# Patient Record
Sex: Male | Born: 1945 | Race: White | Hispanic: No | Marital: Married | Smoking: Never smoker
Health system: Southern US, Community
[De-identification: ages and names within clinical notes are randomized; demographics above are authoritative.]

## PROBLEM LIST (undated history)

## (undated) DIAGNOSIS — K219 Gastro-esophageal reflux disease without esophagitis: Secondary | ICD-10-CM

## (undated) DIAGNOSIS — I209 Angina pectoris, unspecified: Secondary | ICD-10-CM

## (undated) DIAGNOSIS — I10 Essential (primary) hypertension: Secondary | ICD-10-CM

## (undated) DIAGNOSIS — I2510 Atherosclerotic heart disease of native coronary artery without angina pectoris: Secondary | ICD-10-CM

## (undated) HISTORY — PX: CORONARY ANGIOPLASTY WITH STENT PLACEMENT: SHX49

## (undated) HISTORY — PX: KNEE ARTHROSCOPY: SHX127

## (undated) HISTORY — PX: HERNIA REPAIR: SHX51

## (undated) HISTORY — PX: TONSILLECTOMY: SUR1361

## (undated) HISTORY — PX: CORONARY ARTERY BYPASS GRAFT: SHX141

---

## 1998-04-05 ENCOUNTER — Emergency Department (HOSPITAL_COMMUNITY): Admission: EM | Admit: 1998-04-05 | Discharge: 1998-04-05 | Payer: Self-pay | Admitting: Emergency Medicine

## 2004-06-26 ENCOUNTER — Emergency Department (HOSPITAL_COMMUNITY): Admission: EM | Admit: 2004-06-26 | Discharge: 2004-06-26 | Payer: Self-pay | Admitting: Emergency Medicine

## 2007-01-27 ENCOUNTER — Ambulatory Visit (HOSPITAL_COMMUNITY): Admission: RE | Admit: 2007-01-27 | Discharge: 2007-01-27 | Payer: Self-pay | Admitting: Surgery

## 2011-01-05 ENCOUNTER — Encounter: Payer: Self-pay | Admitting: Family Medicine

## 2012-03-30 ENCOUNTER — Encounter (HOSPITAL_COMMUNITY): Payer: Self-pay | Admitting: Physical Medicine and Rehabilitation

## 2012-03-30 ENCOUNTER — Inpatient Hospital Stay (HOSPITAL_COMMUNITY)
Admission: EM | Admit: 2012-03-30 | Discharge: 2012-04-01 | DRG: 287 | Disposition: A | Discharge status: Home / Self Care | Payer: PRIVATE HEALTH INSURANCE | Attending: Cardiology | Admitting: Cardiology

## 2012-03-30 ENCOUNTER — Emergency Department (HOSPITAL_COMMUNITY): Payer: PRIVATE HEALTH INSURANCE

## 2012-03-30 DIAGNOSIS — I10 Essential (primary) hypertension: Secondary | ICD-10-CM | POA: Diagnosis present

## 2012-03-30 DIAGNOSIS — Z951 Presence of aortocoronary bypass graft: Secondary | ICD-10-CM

## 2012-03-30 DIAGNOSIS — I200 Unstable angina: Secondary | ICD-10-CM | POA: Diagnosis present

## 2012-03-30 DIAGNOSIS — Z8249 Family history of ischemic heart disease and other diseases of the circulatory system: Secondary | ICD-10-CM

## 2012-03-30 DIAGNOSIS — Z79899 Other long term (current) drug therapy: Secondary | ICD-10-CM

## 2012-03-30 DIAGNOSIS — Z9861 Coronary angioplasty status: Secondary | ICD-10-CM

## 2012-03-30 DIAGNOSIS — I249 Acute ischemic heart disease, unspecified: Secondary | ICD-10-CM

## 2012-03-30 DIAGNOSIS — E7800 Pure hypercholesterolemia, unspecified: Secondary | ICD-10-CM | POA: Diagnosis present

## 2012-03-30 DIAGNOSIS — I2510 Atherosclerotic heart disease of native coronary artery without angina pectoris: Principal | ICD-10-CM | POA: Diagnosis present

## 2012-03-30 DIAGNOSIS — I2582 Chronic total occlusion of coronary artery: Secondary | ICD-10-CM | POA: Diagnosis present

## 2012-03-30 HISTORY — DX: Angina pectoris, unspecified: I20.9

## 2012-03-30 HISTORY — DX: Atherosclerotic heart disease of native coronary artery without angina pectoris: I25.10

## 2012-03-30 HISTORY — DX: Essential (primary) hypertension: I10

## 2012-03-30 HISTORY — DX: Gastro-esophageal reflux disease without esophagitis: K21.9

## 2012-03-30 LAB — CARDIAC PANEL(CRET KIN+CKTOT+MB+TROPI)
CK, MB: 2.7 ng/mL (ref 0.3–4.0)
Relative Index: INVALID (ref 0.0–2.5)
Relative Index: INVALID (ref 0.0–2.5)
Total CK: 82 U/L (ref 7–232)
Total CK: 75 U/L (ref 7–232)

## 2012-03-30 LAB — CBC
HCT: 46.4 % (ref 39.0–52.0)
MCV: 95.5 fL (ref 78.0–100.0)
Platelets: 198 10*3/uL (ref 150–400)
Platelets: 213 10*3/uL (ref 150–400)
RBC: 5.08 MIL/uL (ref 4.22–5.81)
RBC: 4.9 MIL/uL (ref 4.22–5.81)
RDW: 12.6 % (ref 11.5–15.5)
WBC: 6.2 10*3/uL (ref 4.0–10.5)
WBC: 7.6 10*3/uL (ref 4.0–10.5)

## 2012-03-30 LAB — DIFFERENTIAL
Basophils Absolute: 0 10*3/uL (ref 0.0–0.1)
Basophils Absolute: 0 10*3/uL (ref 0.0–0.1)
Eosinophils Relative: 1 % (ref 0–5)
Lymphocytes Relative: 24 % (ref 12–46)
Lymphocytes Relative: 26 % (ref 12–46)
Lymphs Abs: 1.5 10*3/uL (ref 0.7–4.0)
Lymphs Abs: 2 10*3/uL (ref 0.7–4.0)
Neutro Abs: 4.1 10*3/uL (ref 1.7–7.7)
Neutro Abs: 4.8 10*3/uL (ref 1.7–7.7)

## 2012-03-30 LAB — COMPREHENSIVE METABOLIC PANEL
ALT: 36 U/L (ref 0–53)
ALT: 34 U/L (ref 0–53)
AST: 32 U/L (ref 0–37)
AST: 31 U/L (ref 0–37)
Albumin: 3.9 g/dL (ref 3.5–5.2)
Alkaline Phosphatase: 72 U/L (ref 39–117)
Alkaline Phosphatase: 62 U/L (ref 39–117)
CO2: 27 mEq/L (ref 19–32)
Calcium: 9.8 mg/dL (ref 8.4–10.5)
Chloride: 103 mEq/L (ref 96–112)
Chloride: 102 mEq/L (ref 96–112)
GFR calc Af Amer: >90 mL/min (ref >90)
GFR calc non Af Amer: 86 mL/min — ABNORMAL LOW (ref >90)
Glucose, Bld: 107 mg/dL — ABNORMAL HIGH (ref 70–99)
Potassium: 4.5 mEq/L (ref 3.5–5.1)
Potassium: 3.9 mEq/L (ref 3.5–5.1)
Sodium: 140 mEq/L (ref 135–145)
Total Bilirubin: 0.8 mg/dL (ref 0.3–1.2)
Total Bilirubin: 0.9 mg/dL (ref 0.3–1.2)

## 2012-03-30 LAB — APTT: aPTT: 32 seconds (ref 24–37)

## 2012-03-30 LAB — TSH: TSH: 1.49 u[IU]/mL (ref 0.350–4.500)

## 2012-03-30 LAB — PROTIME-INR: Prothrombin Time: 12.7 seconds (ref 11.6–15.2)

## 2012-03-30 MED ORDER — ONDANSETRON HCL 4 MG/2ML IJ SOLN: 4.0000 mg | Freq: Four times a day (QID) | INTRAMUSCULAR | Status: DC | PRN

## 2012-03-30 MED ORDER — POTASSIUM CHLORIDE IN NACL 20-0.9 MEQ/L-% IV SOLN
INTRAVENOUS | Status: DC
  Administered 2012-03-30: 18:00:00 via INTRAVENOUS
  Filled 2012-03-30 (×2): qty 1000

## 2012-03-30 MED ORDER — CLOPIDOGREL BISULFATE 300 MG PO TABS
300.0000 mg | ORAL_TABLET | Freq: Once | ORAL | Status: AC
  Administered 2012-03-30: 300 mg via ORAL
  Filled 2012-03-30: qty 1

## 2012-03-30 MED ORDER — PANTOPRAZOLE SODIUM 40 MG PO TBEC
40.0000 mg | DELAYED_RELEASE_TABLET | Freq: Every day | ORAL | Status: DC
  Administered 2012-03-30 – 2012-04-01 (×2): 40 mg via ORAL
  Filled 2012-03-30 (×2): qty 1

## 2012-03-30 MED ORDER — OMEGA-3-ACID ETHYL ESTERS 1 G PO CAPS
1.0000 g | ORAL_CAPSULE | Freq: Two times a day (BID) | ORAL | Status: DC
  Administered 2012-03-30 – 2012-04-01 (×4): 1 g via ORAL
  Filled 2012-03-30 (×5): qty 1

## 2012-03-30 MED ORDER — ASPIRIN 81 MG PO CHEW: 324.0000 mg | CHEWABLE_TABLET | ORAL | Status: AC

## 2012-03-30 MED ORDER — DIAZEPAM 5 MG PO TABS
5.0000 mg | ORAL_TABLET | ORAL | Status: AC
  Administered 2012-03-31: 5 mg via ORAL
  Filled 2012-03-30: qty 1

## 2012-03-30 MED ORDER — SODIUM CHLORIDE 0.9 % IJ SOLN
3.0000 mL | Freq: Two times a day (BID) | INTRAMUSCULAR | Status: DC
  Administered 2012-03-31: 3 mL via INTRAVENOUS

## 2012-03-30 MED ORDER — ASPIRIN 81 MG PO CHEW
324.0000 mg | CHEWABLE_TABLET | Freq: Once | ORAL | Status: AC
  Administered 2012-03-30: 324 mg via ORAL
  Filled 2012-03-30: qty 4

## 2012-03-30 MED ORDER — ASPIRIN 300 MG RE SUPP: 300.0000 mg | RECTAL | Status: AC

## 2012-03-30 MED ORDER — SODIUM CHLORIDE 0.9 % IV SOLN: 250.0000 mL | INTRAVENOUS | Status: DC | PRN

## 2012-03-30 MED ORDER — HEPARIN BOLUS VIA INFUSION
4000.0000 [IU] | Freq: Once | INTRAVENOUS | Status: AC
  Administered 2012-03-30: 4000 [IU] via INTRAVENOUS

## 2012-03-30 MED ORDER — ATORVASTATIN CALCIUM 80 MG PO TABS
80.0000 mg | ORAL_TABLET | Freq: Every day | ORAL | Status: DC
  Administered 2012-03-30 – 2012-03-31 (×2): 80 mg via ORAL
  Filled 2012-03-30 (×3): qty 1

## 2012-03-30 MED ORDER — METOPROLOL TARTRATE 25 MG PO TABS
25.0000 mg | ORAL_TABLET | Freq: Two times a day (BID) | ORAL | Status: DC
  Administered 2012-03-30 – 2012-03-31 (×2): 25 mg via ORAL
  Filled 2012-03-30 (×3): qty 1

## 2012-03-30 MED ORDER — ASPIRIN 81 MG PO CHEW
324.0000 mg | CHEWABLE_TABLET | ORAL | Status: AC
  Administered 2012-03-31: 324 mg via ORAL
  Filled 2012-03-30: qty 4

## 2012-03-30 MED ORDER — SODIUM CHLORIDE 0.9 % IJ SOLN: 3.0000 mL | INTRAMUSCULAR | Status: DC | PRN

## 2012-03-30 MED ORDER — NITROGLYCERIN IN D5W 200-5 MCG/ML-% IV SOLN: 5.0000 ug/min | INTRAVENOUS | Status: DC

## 2012-03-30 MED ORDER — ACETAMINOPHEN 325 MG PO TABS: 650.0000 mg | ORAL_TABLET | ORAL | Status: DC | PRN

## 2012-03-30 MED ORDER — NITROGLYCERIN 0.4 MG SL SUBL: 0.4000 mg | SUBLINGUAL_TABLET | SUBLINGUAL | Status: DC | PRN

## 2012-03-30 MED ORDER — HEPARIN (PORCINE) IN NACL 100-0.45 UNIT/ML-% IJ SOLN
800.0000 [IU]/h | INTRAMUSCULAR | Status: DC
  Administered 2012-03-30: 800 [IU]/h via INTRAVENOUS
  Filled 2012-03-30: qty 250

## 2012-03-30 MED ORDER — ALPRAZOLAM 0.25 MG PO TABS
0.2500 mg | ORAL_TABLET | Freq: Two times a day (BID) | ORAL | Status: DC | PRN
  Administered 2012-03-31: 0.25 mg via ORAL
  Filled 2012-03-30: qty 1

## 2012-03-30 MED ORDER — ASPIRIN EC 81 MG PO TBEC
81.0000 mg | DELAYED_RELEASE_TABLET | Freq: Every day | ORAL | Status: DC
  Administered 2012-04-01: 81 mg via ORAL
  Filled 2012-03-30 (×2): qty 1

## 2012-03-30 MED ORDER — SODIUM CHLORIDE 0.9 % IV SOLN
INTRAVENOUS | Status: DC
  Administered 2012-03-30 – 2012-03-31 (×3): via INTRAVENOUS

## 2012-03-30 NOTE — ED Notes (Signed)
Pt presents to department for evaluation of midsternal non radiating chest pain. Intermittent x2 weeks, states pain became worse this morning. Also states nausea and diaphoresis. History of CABG. Nothing makes pain worse at the time. States mild pressure, 2/10 at the time. Also states severe anxiety and stress from family issues lately. He is alert and oriented x4.

## 2012-03-30 NOTE — H&P (Signed)
[name] is an 66 y.o. male.   Chief Complaint: Chest pain HPI: Patient is 66 year old white male with past medical history significant for coronary artery disease status post PTCA stenting in the past in 2003 her in [facility] subsequently had CABG in tube 2004 hypertension hypercholesteremia positive family history of coronary artery disease was transferred from [facility] patient complaints off for retrosternal chest pain described as pressure grade 4/10 which started around 4 AM this morning off and on associated with nausea and diaphoresis associated with feeling weak and tired  patient states lately he has been under a lot of stress which triggers the chest pain and minimal exertion. Denies any palpitation lightheadedness or syncope denies history of PND orthopnea leg swelling denies any recent cardiac workup since his open-heart surgery in 2004.  History reviewed. No pertinent past medical history.  Past Surgical History  Procedure Date  . Coronary artery bypass graft     History reviewed. No pertinent family history. Social History:  reports that he has never smoked. He does not have any smokeless tobacco history on file. He reports that he does not drink alcohol or use illicit drugs.  Allergies: No Known Allergies  Medications Prior to Admission  Medication Dose Route Frequency Provider Last Rate Last Dose  . aspirin chewable tablet 324 mg  324 mg Oral Once Joya Gaskins, MD   324 mg at 03/30/12 1324   Medications Prior to Admission  Medication Sig Dispense Refill  . metoprolol succinate (TOPROL-XL) 100 MG 24 hr tablet Take 50 mg by mouth daily. Patient takes at lunchtime Take with or immediately following a meal.      . pravastatin (PRAVACHOL) 40 MG tablet Take 40 mg by mouth every evening.        Results for orders placed during the hospital encounter of 03/30/12 (from the past 48 hour(s))  CBC     Status: Abnormal   Collection  Time   03/30/12 11:57 AM      Component Value Range Comment   WBC 6.2  4.0 - 10.5 (K/uL)    RBC 5.08  4.22 - 5.81 (MIL/uL)    Hemoglobin 17.4 (*) 13.0 - 17.0 (g/dL)    HCT 16.1  09.6 - 04.5 (%)    MCV 95.5  78.0 - 100.0 (fL)    MCH 34.3 (*) 26.0 - 34.0 (pg)    MCHC 35.9  30.0 - 36.0 (g/dL)    RDW 40.9  81.1 - 91.4 (%)    Platelets 198  150 - 400 (K/uL)   DIFFERENTIAL     Status: Normal   Collection Time   03/30/12 11:57 AM      Component Value Range Comment   Neutrophils Relative 66  43 - 77 (%)    Neutro Abs 4.1  1.7 - 7.7 (K/uL)    Lymphocytes Relative 24  12 - 46 (%)    Lymphs Abs 1.5  0.7 - 4.0 (K/uL)    Monocytes Relative 9  3 - 12 (%)    Monocytes Absolute 0.5  0.1 - 1.0 (K/uL)    Eosinophils Relative 1  0 - 5 (%)    Eosinophils Absolute 0.1  0.0 - 0.7 (K/uL)    Basophils Relative 1  0 - 1 (%)    Basophils Absolute 0.0  0.0 - 0.1 (K/uL)   COMPREHENSIVE METABOLIC PANEL     Status: Abnormal   Collection Time   03/30/12 11:57 AM  Component Value Range Comment   Sodium 140  135 - 145 (mEq/L)    Potassium 4.5  3.5 - 5.1 (mEq/L)    Chloride 103  96 - 112 (mEq/L)    CO2 27  19 - 32 (mEq/L)    Glucose, Bld 107 (*) 70 - 99 (mg/dL)    BUN 12  6 - 23 (mg/dL)    Creatinine, Ser 6.96  0.50 - 1.35 (mg/dL)    Calcium 9.8  8.4 - 10.5 (mg/dL)    Total Protein 7.3  6.0 - 8.3 (g/dL)    Albumin 4.0  3.5 - 5.2 (g/dL)    AST 32  0 - 37 (U/L)    ALT 36  0 - 53 (U/L)    Alkaline Phosphatase 72  39 - 117 (U/L)    Total Bilirubin 0.8  0.3 - 1.2 (mg/dL)    GFR calc non Af Amer 86 (*) >90 (mL/min)    GFR calc Af Amer >90  >90 (mL/min)   POCT I-STAT TROPONIN I     Status: Normal   Collection Time   03/30/12 12:12 PM      Component Value Range Comment   Troponin i, poc 0.00  0.00 - 0.08 (ng/mL)    Comment 3             Dg Chest 2 View  03/30/2012  *RADIOLOGY REPORT*  Clinical Data: Chest pain, status post CABG  CHEST - 2 VIEW  Comparison: 01/22/2007  Findings: Cardiomediastinal  silhouette is stable.  No acute infiltrate or pleural effusion.  No pulmonary edema.  Bony thorax is stable.  IMPRESSION: Status post CABG.  No active disease.  Original Report Authenticated By: Natasha Mead, M.D.    Review of Systems  Constitutional: Negative for fever, chills and weight loss.  HENT: Negative for hearing loss.   Eyes: Negative for blurred vision and double vision.  Respiratory: Negative for cough, hemoptysis and sputum production.   Cardiovascular: Positive for chest pain. Negative for palpitations, orthopnea, claudication, leg swelling and PND.  Gastrointestinal: Positive for nausea. Negative for vomiting and abdominal pain.  Genitourinary: Negative for dysuria and urgency.  Musculoskeletal: Negative for myalgias.  Neurological: Negative for dizziness and headaches.    Blood pressure 157/93, pulse 71, temperature 97.6 F (36.4 C), temperature source Oral, resp. rate 13, SpO2 99.00%. Physical Exam  Constitutional: He is oriented to person, place, and time. He appears well-developed and well-nourished.  HENT:  Head: Normocephalic and atraumatic.  Eyes: Conjunctivae are normal. Pupils are equal, round, and reactive to light. Left eye exhibits no discharge. No scleral icterus.  Neck: Normal range of motion. Neck supple. No JVD present. No tracheal deviation present. No thyromegaly present.  Cardiovascular: Normal rate and regular rhythm.  Exam reveals no friction rub.        I soft systolic murmur and S4 gallop noted there is no S3 gallop  Respiratory: Effort normal and breath sounds normal. No respiratory distress. He has no wheezes. He has no rales.  GI: Soft. Bowel sounds are normal. He exhibits no distension. There is no tenderness. There is no rebound and no guarding.  Musculoskeletal: He exhibits no edema and no tenderness.  Lymphadenopathy:    He has no cervical adenopathy.  Neurological: He is alert and oriented to person, place, and time.      Assessment/Plan Acute coronary syndrome Coronary artery disease status post CABG x3 in 2004 Hypertension Hypercholesteremia Positive family history of coronary artery disease Plan To admit to  telemetry Rule out MI protocol Start IV heparin nitrates aspirin statins blockers and ACE inhibitors Heart discussed with patient and his wife regarding her left cath possible PTCA stenting its risk and benefits i.e. death MI stroke need for emergency CABG local vascular complications etc. and consents for PCI   [facility] 03/30/2012, 3:37 PM

## 2012-03-30 NOTE — Progress Notes (Signed)
ANTICOAGULATION CONSULT NOTE - Initial Consult  Pharmacy Consult for Heparin Indication: chest pain/ACS  No Known Allergies  Patient Measurements:   Heparin Dosing Weight: 68   Vital Signs: Temp: 97.6 F (36.4 C) (04/15 1120) Temp src: Oral (04/15 1120) BP: 157/93 mmHg (04/15 1120) Pulse Rate: 71  (04/15 1120)  Labs:  Basename 03/30/12 1157  HGB 17.4*  HCT 48.5  PLT 198  APTT --  LABPROT --  INR --  HEPARINUNFRC --  CREATININE 0.91  CKTOTAL --  CKMB --  TROPONINI --   CrCl is unknown because there is no height on file for the current visit.  Medical History: History reviewed. No pertinent past medical history.   Assessment: 66 yo M being admitted for r/o ACS.  No recent bleeding per patient.  Pharmacy consulted to manage heparin.  Patient to have a cath tomorrow.   Goal of Therapy:  Heparin level 0.3-0.7   Plan:  1.  Heparin 4000 unit IV bolus 2.  Begin heparin gtt at 800 units/hr 3.  Heparin level 6 hours after beginning 4.  Daily heparin level, CBC  Rolland Porter, Pharm.D., BCPS Clinical Pharmacist Pager: [phone]

## 2012-03-30 NOTE — ED Notes (Signed)
Dr harwani at bedside.  

## 2012-03-30 NOTE — ED Notes (Signed)
Pt to be admitted and have cardiac cathetertization in the morning. Denies sx presently.

## 2012-03-30 NOTE — Plan of Care (Signed)
Problem: Phase II Progression Outcomes Goal: Cath/PCI Day Path if indicated Outcome: Completed/Met Date Met:  03/30/12 Scheduled for 03/31/12

## 2012-03-30 NOTE — ED Provider Notes (Signed)
History     CSN: [id number]  Arrival date & time 03/30/12  1117   First MD Initiated Contact with Patient 03/30/12 1302      Chief Complaint  Patient presents with  . Chest Pain     Patient is a 66 y.o. male presenting with chest pain. The history is provided by the patient.  Chest Pain The chest pain began 3 - 5 hours ago. Episode Length: several hours. Chest pain occurs intermittently. The chest pain is improving. The pain is associated with stress. The severity of the pain is moderate. The quality of the pain is described as pressure-like. The pain does not radiate. Chest pain is worsened by stress. Primary symptoms include nausea.  Associated symptoms include diaphoresis. He tried aspirin for the symptoms.    Pt reports intermittent episodes of chest pressure for past several weeks, had an episode this morning and wanted to be evaluated He reports fatigue as well that is increased, mostly at night  PMH - CAD  Past Surgical History  Procedure Date  . Coronary artery bypass graft     History reviewed. No pertinent family history.  History  Substance Use Topics  . Smoking status: Never Smoker   . Smokeless tobacco: Not on file  . Alcohol Use: No      Review of Systems  Constitutional: Positive for diaphoresis.  Cardiovascular: Positive for chest pain.  Gastrointestinal: Positive for nausea.  All other systems reviewed and are negative.    Allergies  Review of patient's allergies indicates no known allergies.  Home Medications   Current Outpatient Rx  Name Route Sig Dispense Refill  . ASPIRIN EC 81 MG PO TBEC Oral Take 81 mg by mouth daily.    Marland Kitchen METOPROLOL SUCCINATE ER 100 MG PO TB24 Oral Take 50 mg by mouth daily. Patient takes at lunchtime Take with or immediately following a meal.    . FISH OIL PO Oral Take 1-2 capsules by mouth 3 (three) times daily. Patient takes 1 in the morning, 1 at lunchtime, 2 with dinner    . PRAVASTATIN SODIUM 40 MG PO TABS Oral  Take 40 mg by mouth every evening.      BP 157/93  Pulse 71  Temp(Src) 97.6 F (36.4 C) (Oral)  Resp 13  SpO2 99%  Physical Exam CONSTITUTIONAL: Well developed/well nourished HEAD AND FACE: Normocephalic/atraumatic EYES: EOMI ENMT: Mucous membranes moist NECK: supple no meningeal signs SPINE:entire spine nontender CV: S1/S2 noted, no murmurs/rubs/gallops noted LUNGS: Lungs are clear to auscultation bilaterally, no apparent distress ABDOMEN: soft, nontender, no rebound or guarding NEURO: Pt is awake/alert, moves all extremitiesx4 EXTREMITIES: pulses normal, full ROM SKIN: warm, color normal PSYCH: no abnormalities of mood noted  ED Course  Procedures (including critical care time)  Labs Reviewed  CBC - Abnormal; Notable for the following:    Hemoglobin 17.4 (*)    MCH 34.3 (*)    All other components within normal limits  COMPREHENSIVE METABOLIC PANEL - Abnormal; Notable for the following:    Glucose, Bld 107 (*)    GFR calc non Af Amer 86 (*)    All other components within normal limits  DIFFERENTIAL  POCT I-STAT TROPONIN I   Dg Chest 2 View  03/30/2012  *RADIOLOGY REPORT*  Clinical Data: Chest pain, status post CABG  CHEST - 2 VIEW  Comparison: 01/22/2007  Findings: Cardiomediastinal silhouette is stable.  No acute infiltrate or pleural effusion.  No pulmonary edema.  Bony thorax is stable.  IMPRESSION:  Status post CABG.  No active disease.  Original Report Authenticated By: Natasha Mead, M.D.    2:08 PM Pt improved at this time D/w cardiology dr Sharyn Lull to see patient ASA ordered for patient No active CP at this time   MDM  Nursing notes reviewed and considered in documentation All labs/vitals reviewed and considered xrays reviewed and considered        Date: 03/30/2012  Rate: 68  Rhythm: normal sinus rhythm  QRS Axis: normal  Intervals: normal  ST/T Wave abnormalities: nonspecific ST changes  Conduction Disutrbances:none     Joya Gaskins,  MD 03/30/12 1411

## 2012-03-30 NOTE — ED Provider Notes (Deleted)
Pt with CP, improved at present EKG reviewed ASA ordered Will need further evaluation BP 157/93  Pulse 71  Temp(Src) 97.6 F (36.4 C) (Oral)  Resp 13  SpO2 99%   Joya Gaskins, MD 03/30/12 1306  Joya Gaskins, MD 03/30/12 1407

## 2012-03-31 ENCOUNTER — Encounter (HOSPITAL_COMMUNITY): Payer: Self-pay | Admitting: General Practice

## 2012-03-31 ENCOUNTER — Encounter (HOSPITAL_COMMUNITY)
Admission: EM | Disposition: A | Discharge status: Home / Self Care | Payer: Self-pay | Source: Home / Self Care | Attending: Cardiology

## 2012-03-31 HISTORY — PX: LEFT HEART CATHETERIZATION WITH CORONARY ANGIOGRAM: SHX5451

## 2012-03-31 LAB — CBC
MCH: 34 pg (ref 26.0–34.0)
MCHC: 35.1 g/dL (ref 30.0–36.0)
MCV: 96.8 fL (ref 78.0–100.0)
Platelets: 181 10*3/uL (ref 150–400)
RDW: 12.9 % (ref 11.5–15.5)

## 2012-03-31 LAB — LIPID PANEL
Cholesterol: 160 mg/dL (ref 0–200)
HDL: 45 mg/dL (ref >39)
Total CHOL/HDL Ratio: 3.6 RATIO
VLDL: 22 mg/dL (ref 0–40)

## 2012-03-31 LAB — CARDIAC PANEL(CRET KIN+CKTOT+MB+TROPI): Relative Index: INVALID (ref 0.0–2.5)

## 2012-03-31 SURGERY — LEFT HEART CATHETERIZATION WITH CORONARY ANGIOGRAM
Anesthesia: LOCAL

## 2012-03-31 MED ORDER — ATROPINE SULFATE 1 MG/ML IJ SOLN
INTRAMUSCULAR | Status: AC
  Filled 2012-03-31: qty 1

## 2012-03-31 MED ORDER — ACETAMINOPHEN 325 MG PO TABS: 650.0000 mg | ORAL_TABLET | ORAL | Status: DC | PRN

## 2012-03-31 MED ORDER — CLOPIDOGREL BISULFATE 75 MG PO TABS
75.0000 mg | ORAL_TABLET | Freq: Every day | ORAL | Status: DC
  Administered 2012-04-01: 75 mg via ORAL
  Filled 2012-03-31: qty 1

## 2012-03-31 MED ORDER — MIDAZOLAM HCL 2 MG/2ML IJ SOLN
INTRAMUSCULAR | Status: AC
  Filled 2012-03-31: qty 2

## 2012-03-31 MED ORDER — HEPARIN (PORCINE) IN NACL 2-0.9 UNIT/ML-% IJ SOLN
INTRAMUSCULAR | Status: AC
  Filled 2012-03-31: qty 1000

## 2012-03-31 MED ORDER — FENTANYL CITRATE 0.05 MG/ML IJ SOLN
INTRAMUSCULAR | Status: AC
  Filled 2012-03-31: qty 2

## 2012-03-31 MED ORDER — METOPROLOL TARTRATE 12.5 MG HALF TABLET
12.5000 mg | ORAL_TABLET | Freq: Two times a day (BID) | ORAL | Status: DC
  Administered 2012-03-31 – 2012-04-01 (×2): 12.5 mg via ORAL
  Filled 2012-03-31 (×3): qty 1

## 2012-03-31 MED ORDER — ONDANSETRON HCL 4 MG/2ML IJ SOLN: 4.0000 mg | Freq: Four times a day (QID) | INTRAMUSCULAR | Status: DC | PRN

## 2012-03-31 MED ORDER — ASPIRIN 81 MG PO CHEW: 81.0000 mg | CHEWABLE_TABLET | Freq: Every day | ORAL | Status: DC

## 2012-03-31 MED ORDER — SODIUM CHLORIDE 0.9 % IV SOLN
INTRAVENOUS | Status: AC
  Administered 2012-03-31: 17:00:00 via INTRAVENOUS

## 2012-03-31 MED ORDER — LIDOCAINE HCL (PF) 1 % IJ SOLN
INTRAMUSCULAR | Status: AC
  Filled 2012-03-31: qty 30

## 2012-03-31 MED ORDER — NITROGLYCERIN 0.2 MG/ML ON CALL CATH LAB
INTRAVENOUS | Status: AC
  Filled 2012-03-31: qty 1

## 2012-03-31 NOTE — Cardiovascular Report (Signed)
NAMEMISTY, RAGO              ACCOUNT NO.:  [id number]  MEDICAL RECORD NO.:  [id number]  LOCATION:  3705                         FACILITY:  [facility]  PHYSICIAN:  Eduardo Osier. Sharyn Lull, M.D. DATE OF BIRTH:  1946/04/22  DATE OF PROCEDURE:  03/31/2012 DATE OF DISCHARGE:                           CARDIAC CATHETERIZATION   PROCEDURE:  Left cardiac cath with selective left and right coronary angiography, visualization of saphenous vein graft, visualization of free radial artery graft, and LIMA graft and LV graphy via right groin using Judkins technique.  INDICATION FOR THE PROCEDURE:  The patient is a 66 year old white male with past medical history significant for coronary artery disease, history of PTCA stenting in the past in 2003 in [facility], subsequently required CABG x3 in 2004.  He had LIMA to LAD, free radial artery graft to obtuse marginal 2 and saphenous vein graft to distal RCA, history of hypertension, hypercholesteremia, positive family history of coronary artery disease was transferred from [facility].  The patient complained of off and on retrosternal chest pain described as pressure, grade 4/10 which started around 4:00 a.m. yesterday morning associated with nausea and diaphoresis which woke him up, associated with feeling weak and tired. The patient states lately he has been under lot of stress which triggers the chest pain with minimal exertion.  Denies any palpitation, lightheadedness, or syncope.  Denies PND, orthopnea, or leg swelling. Denies any recent cardiac workup since his open-heart surgery in 2004. Due to typical anginal chest pain, multiple risk factors, discussed with the patient regarding noninvasive stress testing versus left cath possible PTCA stenting, its risks and benefits, i.e., death, MI, stroke, need for emergency CABG, local vascular complications, etc., and consented for PCI.  PROCEDURE:  After obtaining  informed consent, the patient was brought to the cath lab and was placed on fluoroscopy table.  Right groin was prepped and draped in usual fashion.  A 1% Xylocaine was used for local anesthesia in the right groin.  With the help of thin wall needle, a 6- French arterial sheath was placed.  The sheath was aspirated and flushed.  Next, 6-French left Judkins catheter was advanced over the wire under fluoroscopic guidance up to the ascending aorta.  Wire was pulled out. The catheter was aspirated and connected to the Manifold.  Catheter was further advanced and engaged into left coronary ostium.  Multiple views of the left system were taken.  Next, catheter was disengaged and was pulled out over the wire and was replaced with 6-French right Judkins catheter, which was advanced over the wire under fluoroscopic guidance up to the ascending aorta.  Wire was pulled out.  The catheter was aspirated and connected to the Manifold.  Catheter was further advanced and engaged into right coronary ostium.  Multiple views of the right system were taken.  Next, the catheter was disengaged and was engaged into saphenous vein graft to distal RCA.  Multiple views of this graft were taken.  Next, the catheter was disengaged and was attempted to advance to LIMA to LAD without success.  This catheter was exchanged over the wire with LIMA and diagnostic catheter, which was advanced over the wire under  fluoroscopic guidance up to the ascending aorta.  Catheter was further advanced over the wire into the left subclavian artery.  The wire was pulled out.  The catheter was aspirated and connected to the Manifold. Catheter was further advanced and engaged into LIMA to LAD.  Multiple views of this graft were taken.  Next, catheter was disengaged and was pulled out over the wire and was replaced with 6-French left bypass diagnostic catheter, which was advanced over the wire under fluoroscopic guidance up to the  ascending aorta.  Wire was pulled out.  The catheter was aspirated and connected to the Manifold.  Catheter was further advanced and engaged into saphenous vein graft to obtuse marginal 2.  Multiple views of this graft were taken.  Next, catheter was disengaged and was pulled out over the wire and was replaced with 6-French pigtail catheter which was advanced over the wire under fluoroscopic guidance up to the ascending aorta.  Catheter was further advanced across the aortic valve into the LV.  LV pressures were recorded.  Next, LV graft was in 30 degree RAO position.  Post angiographic pressures were recorded from LV and then pullback pressures were recorded from the aorta.  There was no significant gradient across the aortic valve.  Next, pigtail catheter was pulled out over the wire.  Sheaths were aspirated and flushed.  FINDINGS:  LV showed good LV systolic function.  Left main was diffusely diseased in proximal and midportion and then had 90% distal focal stenosis.  LAD has 85-90% ostial and proximal stenosis and then it is 100% occluded.  Diagonal 1 is 100% occluded.  Diagonal 2 is very small. Ramus is very, very small.  Left circumflex has 80-90% ostial and proximal, and 85-90% mid stenosis just beyond the origin of OM-2.  OM-1 is very, very small.  OM-2 has 80-85% proximal stenosis and then 100% occluded filling by competitive flow from saphenous vein graft to OM-2. RCA has 90-95% ostial stenosis and 20-25% proximal stenosis and 60-85% distal stenosis.  Saphenous vein graft to distal RCA was patent.  Ostial PDA had 60-70% focal smooth stenosis.  Radial artery graft to OM-2 was patent, which was filling the distal circ.  LIMA to LAD is patent.  The patient tolerated the procedure well.  There were no complications. The patient was transferred to recovery room in stable condition.  Plan is to maximize antianginal medication.  If he continues to have recurrent chest pain, may  consider PCI to native left circumflex system.     Eduardo Osier. Sharyn Lull, M.D.     [facility]/MEDQ  D:  03/31/2012  T:  03/31/2012  Job:  [id number]

## 2012-03-31 NOTE — Progress Notes (Signed)
ANTICOAGULATION CONSULT NOTE - Follow Up Consult  Pharmacy Consult for heparin Indication: chest pain/ACS  Labs:  [facility] 03/30/12 2222 03/30/12 1627 03/30/12 1157  HGB -- 16.4 17.4*  HCT -- 46.4 48.5  PLT -- 213 198  APTT -- 32 --  LABPROT -- 12.7 --  INR -- 0.93 --  HEPARINUNFRC -- -- --  CREATININE -- 0.87 0.91  CKTOTAL 75 82 --  CKMB 2.7 2.9 --  TROPONINI <0.30 <0.30 --    Assessment/Plan: 66yo male therapeutic on heparin with initial dosing for CP.  Will continue heparin gtt at current rate and confirm stable with am labs.   Colleen Can PharmD BCPS 03/31/2012,12:48 AM

## 2012-03-31 NOTE — Progress Notes (Signed)
ANTICOAGULATION CONSULT NOTE - Follow Up Consult  Pharmacy Consult for Heparin Indication: chest pain/ACS  No Known Allergies  Patient Measurements: Weight: 149 lb 6.6 oz (67.772 kg) Heparin Dosing Weight: 68 kg  Vital Signs: Temp: 97.3 F (36.3 C) (04/16 0623) Temp src: Oral (04/16 0623) BP: 163/95 mmHg (04/16 0936) Pulse Rate: 58  (04/16 0936)  Labs:  Basename 03/31/12 0511 03/30/12 2354 03/30/12 2222 03/30/12 1627 03/30/12 1157  HGB 15.7 -- -- 16.4 --  HCT 44.7 -- -- 46.4 48.5  PLT 181 -- -- 213 198  APTT -- -- -- 32 --  LABPROT -- -- -- 12.7 --  INR -- -- -- 0.93 --  HEPARINUNFRC 0.59 0.55 -- -- --  CREATININE -- -- -- 0.87 0.91  CKTOTAL 75 -- 75 82 --  CKMB 2.9 -- 2.7 2.9 --  TROPONINI <0.30 -- <0.30 <0.30 --   CrCl is unknown because there is no height on file for the current visit.   Assessment: 66 y.o. M on heparin drip for ACS sx while awaiting cardiac cath planned for this morning. Heparin level this morning is therapeutic (HL 0.59, goal of 0.3-0.7). Hgb/Hct/Plt slight drop -- however no s/sx of bleeding noted. Heparin infusing at appropriate rate.  Goal of Therapy:  Heparin level 0.3-0.7 units/ml   Plan:  1. Continue heparin at current rate of 800 units/hr (8 ml/hr) 2. Will continue to monitor for any signs/symptoms of bleeding and will follow up with heparin plans post cath or with a heparin level in the a.m if cath postponed  Georgina Pillion, PharmD, BCPS Clinical Pharmacist Pager: [phone] 03/31/2012 10:15 AM

## 2012-03-31 NOTE — CV Procedure (Signed)
Left cardiac cath report dictated found for 16 2013 dictation number is [phone]

## 2012-03-31 NOTE — H&P (Signed)
  Her no change in the H&P

## 2012-04-01 LAB — CBC
HCT: 42.3 % (ref 39.0–52.0)
Hemoglobin: 15 g/dL (ref 13.0–17.0)
MCH: 33.9 pg (ref 26.0–34.0)
MCHC: 35.5 g/dL (ref 30.0–36.0)
MCV: 95.7 fL (ref 78.0–100.0)
Platelets: 171 10*3/uL (ref 150–400)
RBC: 4.42 MIL/uL (ref 4.22–5.81)
RDW: 12.6 % (ref 11.5–15.5)
WBC: 6.8 10*3/uL (ref 4.0–10.5)

## 2012-04-01 LAB — POCT ACTIVATED CLOTTING TIME: Activated Clotting Time: 133 s

## 2012-04-01 MED ORDER — CLOPIDOGREL BISULFATE 75 MG PO TABS: 75.0000 mg | ORAL_TABLET | Freq: Every day | ORAL | Status: AC

## 2012-04-01 MED ORDER — AMLODIPINE BESYLATE 5 MG PO TABS: 5.0000 mg | ORAL_TABLET | Freq: Every day | ORAL | Status: DC

## 2012-04-01 MED ORDER — NITROGLYCERIN 0.4 MG SL SUBL: 0.4000 mg | SUBLINGUAL_TABLET | SUBLINGUAL | Status: DC | PRN

## 2012-04-01 MED ORDER — METOPROLOL SUCCINATE ER 50 MG PO TB24: 50.0000 mg | ORAL_TABLET | Freq: Every day | ORAL | Status: DC

## 2012-04-01 NOTE — Progress Notes (Signed)
UR Completed. Simmons, Harumi Yamin F [phone]  

## 2012-04-01 NOTE — Discharge Instructions (Signed)
Coronary Angiography Coronary angiography is an X-ray procedure used to look at the arteries in the heart. In this procedure, a dye is injected through a long, hollow tube (catheter). The catheter is about the size of a piece of cooked spaghetti. The catheter injects a dye into an artery in your groin. X-rays are then taken to show if there is a blockage in the arteries of your heart. BEFORE THE PROCEDURE   Let your caregiver know if you have allergies to shellfish or contrast dye. Also let your caregiver know if you have kidney problems or failure.   Do not eat or drink starting from midnight up to the time of the procedure, or as directed.   You may drink enough water to take your medications the morning of the procedure if you were instructed to do so.   You should be at the hospital or outpatient facility where the procedure is to be done 60 minutes prior to the procedure or as directed.  PROCEDURE  You may be given an IV medication to help you relax before the procedure.   You will be prepared for the procedure by washing and shaving the area where the catheter will be inserted. This is usually done in the groin but may be done in the fold of your arm by your elbow.   A medicine will be given to numb your groin where the catheter will be inserted.   A specially trained doctor will insert the catheter into an artery in your groin. The catheter is guided by using a special type of X-ray (fluoroscopy) to the blood vessel being examined.   A special dye is then injected into the catheter and X-rays are taken. The dye helps to show where any narrowing or blockages are located in the heart arteries.  AFTER THE PROCEDURE   After the procedure you will be kept in bed lying flat for several hours. You will be instructed to not bend or cross your legs.   The groin insertion site will be watched and checked frequently.   The pulse in your feet will be checked frequently.   Additional blood  tests, X-rays and an EKG may be done.   You may stay in the hospital overnight for observation.  SEEK IMMEDIATE MEDICAL CARE IF:   You develop chest pain, shortness of breath, feel faint, or pass out.   There is bleeding, swelling, or drainage from the catheter insertion site.   You develop pain, discoloration, coldness, or severe bruising in the leg or area where the catheter was inserted.   You have a fever.  Document Released: 06/08/2003 Document Revised: 11/21/2011 Document Reviewed: 07/27/2008 [facility] Patient Information 2012 [place], [place].Angina Angina is chest discomfort caused by lack of oxygen to the heart muscle. It is a warning sign that there is a blood flow problem to your heart. Angina is referred to as either stable or unstable. Stable angina often happens with the same kind of activity, lasts a few minutes, and feels the same each time. Unstable angina has no pattern, no warning, lasts longer, and is more serious. Unstable angina might predict a heart attack. HOME CARE   Understand how to take your medicine and what side effects to expect.   Do not stop the medicines.   Do not change how much you take (dosage) on your own.   Write down any side effects. Tell your doctor what they are.   Mild exercise may help. Start exercising only as told  by your doctor.   You can still have a sexual relationship if it does not cause angina. Tell your doctor if it does.   Stop smoking. Do not use gum or patches that help people quit smoking until you check with your doctor.   Lose weight if you are overweight. Eat a heart-healthy diet that is low in fat and salt.   Keep all follow-up visits with your doctor. This is important!  GET HELP RIGHT AWAY IF:   Your angina seems to happen more often or lasts longer.   You are having side effects from your medicine.   Your chest pain spreads to the arms, back, neck, or jaw (especially if the pain is crushing or pressure-like).    You are sweating, feel sick to your stomach (nauseous), or have shortness of breath.   You have an attack that does not get better after rest or taking medicine.   You wake from sleep with chest pain.   You feel dizzy, faint, or feel very tired (fatigued).   You have chest pain that is different from your usual angina.  Any of these problems may be a sign of a serious problem that is an emergency. Do not wait to see if the problems will go away. Get medical help right away. Call your local emergency services (911 in [place] Do not drive yourself to the hospital. MAKE SURE YOU:   Understand these instructions.   Will watch your condition.   Will get help right away if you are not doing well or get worse.  Document Released: 05/20/2008 Document Revised: 11/21/2011 Document Reviewed: 05/20/2008 [facility] Patient Information 2012 [place], [place].

## 2012-04-01 NOTE — Progress Notes (Signed)
Pt and pt wife provided with d/c instructions, education and prescriptions. Pt and pt wife both verbalized understanding and were able to teachback how to take medications. Pt appears stable for d/c. No concerns at this time. IV removed with tip intact. Heart monitor returned to front. VSS. Will continue to monitor until patient finishes lunch and leaves floor. Ramond Craver, RN

## 2012-04-01 NOTE — Discharge Summary (Signed)
[name], [name]              ACCOUNT NO.:  [id number]  MEDICAL RECORD NO.:  [id number]  LOCATION:  3705                         FACILITY:  [facility]  PHYSICIAN:  Eduardo Osier. Sharyn Lull, M.D. DATE OF BIRTH:  01/11/1946  DATE OF ADMISSION:  03/30/2012 DATE OF DISCHARGE:  04/01/2012                              DISCHARGE SUMMARY   ADMITTING DIAGNOSES: 1. Acute coronary syndrome. 2. Coronary artery disease. 3. History of coronary artery bypass graft x3 in 2004 at [facility]. 4. Hypertension. 5. Hypercholesteremia. 6. Positive family history of coronary artery disease.  DISCHARGE DIAGNOSES: 1. Stable angina, myocardial infarction ruled out, status post left     catheterization. 2. Coronary artery disease, status post coronary artery bypass graft     x3 in 2004. 3. Hypertension. 4. Hypercholesteremia. 5. Positive family history of coronary artery disease.  DISCHARGE HOME MEDICATIONS: 1. Amlodipine 5 mg 1 tab daily. 2. Clopidogrel 75 mg 1 tab daily. 3. Enteric-coated aspirin 81 mg 1 tablet daily. 4. Nitrostat 0.4 mg sublingual use as directed. 5. Toprol-XL 50 mg 1 tab daily. 6. Fish oil 1 capsule 3 times daily as before. 7. Pravastatin 40 mg 1 tablet every evening.  DIET:  Low salt, low cholesterol.  ACTIVITY:  Increase activity slowly as tolerated.  Post-cardiac cath instructions have been given.  Follow up with me in 1 week.  CONDITION ON DISCHARGE:  Stable.  BRIEF HISTORY:  [name] is a 66 year old male with past medical history significant for coronary artery disease, status post PTCA stenting in the past and subsequently had CABG in [facility] in 2003, he had LIMA to LAD, free radial graft to obtuse marginal, saphenous vein graft to RCA; hypertension; hypercholesteremia; positive family history of coronary artery disease.  He was transferred from [facility].  The patient complains of retrosternal chest  pain described as pressure, grade 4/10, off and on, started around 4:00 a.m. in the morning, associated with nausea and diaphoresis, associated with feeling weak and tired.  The patient states lately he has been under lot of stress, which triggers the chest pain and also with minimal exertion.  Denies any palpitation, lightheadedness, or syncope.  Denies any history of PND, orthopnea, or leg swelling.  Denies any recent cardiac workup since his open-heart surgery in 2004.  PHYSICAL EXAMINATION:  VITAL SIGNS:  Blood pressure was 157/93, pulse was 71.  He was afebrile. HEENT:  Conjunctiva was pink. NECK:  Supple.  No JVD.  No bruit. LUNGS:  Clear to auscultation without rhonchi or rales. CARDIOVASCULAR:  S1, S2 was normal.  There was soft systolic murmur and S4 gallop.  There was no S3 gallop. ABDOMEN:  Soft.  Bowel sounds are present.  Nontender. EXTREMITIES:  There was no clubbing, cyanosis, or edema. NEURO:  Grossly intact.  LABS:  Sodium was 140, potassium 4.5, BUN 12, creatinine 0.91.  Glucose was 107, repeat fasting blood sugar was 91.  Three sets of cardiac enzymes were negative.  Cholesterol was 160, triglyceride 109, HDL 45, LDL 93.  Hemoglobin was 17.4, hematocrit 48.5, white count 6.2.  His EKG showed normal sinus rhythm with nonspecific ST-T wave changes.  Chest x-ray showed no active disease.  BRIEF HOSPITAL COURSE:  The patient was admitted to telemetry unit.  MI was ruled out by serial enzymes and EKG.  I discussed with the patient regarding noninvasive stress testing versus left cath, possible PTCA with stenting, its risks and benefits, the patient consented for PCI. The patient subsequently underwent left cardiac cath with selective left and right coronary angiography, visualization of saphenous vein graft, LIMA graft, and free radial artery graft via right groin, without any complications.  The patient had critical stenosis beyond the origin of OM 2 and in left  circumflex, which was not bypassed, but the patient also has complex bifurcation stenosis at the distal left main and ostial left circumflex.  The patient did not have any further episodes of chest pain during the hospital stay.  I discussed at length with the patient and his wife regarding medical management and adding Plavix.  At this point, unless he gets recurrent chest pain then we will require long stent from mid circ to ostial left main.  Patient's groin is stable with no evidence of hematoma or bruit.  The patient has been ambulating in room without any problems.  The patient will be discharged home on above medications and will be followed up in my office in 1 week.  The patient did not have episode of bradycardia.  His beta-blocker dose has been reduced to half tablet 50 mg daily, metoprolol-XL 50 mg daily, and Norvasc 5 mg daily has been added.  Also Plavix 75 mg has been added to his home medications.  The patient will be followed up in my office in 1 week.     Eduardo Osier. Sharyn Lull, M.D.     [facility]/MEDQ  D:  04/01/2012  T:  04/01/2012  Job:  [id number]

## 2012-04-01 NOTE — Discharge Summary (Signed)
  Discharge summary dictated on 04/01/2012 dictation number is [id number]

## 2012-09-28 ENCOUNTER — Other Ambulatory Visit: Payer: Self-pay | Admitting: Family Medicine

## 2012-09-28 DIAGNOSIS — R42 Dizziness and giddiness: Secondary | ICD-10-CM

## 2012-09-28 DIAGNOSIS — R269 Unspecified abnormalities of gait and mobility: Secondary | ICD-10-CM

## 2012-09-29 ENCOUNTER — Other Ambulatory Visit: Payer: PRIVATE HEALTH INSURANCE

## 2012-09-29 ENCOUNTER — Other Ambulatory Visit: Payer: Self-pay | Admitting: Family Medicine

## 2012-09-29 DIAGNOSIS — Z139 Encounter for screening, unspecified: Secondary | ICD-10-CM

## 2012-09-29 DIAGNOSIS — R269 Unspecified abnormalities of gait and mobility: Secondary | ICD-10-CM

## 2012-09-30 ENCOUNTER — Ambulatory Visit
Admission: RE | Admit: 2012-09-30 | Discharge: 2012-09-30 | Disposition: A | Discharge status: Home / Self Care | Payer: PRIVATE HEALTH INSURANCE | Source: Ambulatory Visit | Attending: Family Medicine | Admitting: Family Medicine

## 2012-09-30 DIAGNOSIS — R269 Unspecified abnormalities of gait and mobility: Secondary | ICD-10-CM

## 2012-09-30 DIAGNOSIS — Z139 Encounter for screening, unspecified: Secondary | ICD-10-CM

## 2012-10-03 ENCOUNTER — Ambulatory Visit
Admission: RE | Admit: 2012-10-03 | Discharge: 2012-10-03 | Disposition: A | Discharge status: Home / Self Care | Payer: PRIVATE HEALTH INSURANCE | Source: Ambulatory Visit | Attending: Family Medicine | Admitting: Family Medicine

## 2012-10-03 DIAGNOSIS — R269 Unspecified abnormalities of gait and mobility: Secondary | ICD-10-CM

## 2012-10-03 DIAGNOSIS — R42 Dizziness and giddiness: Secondary | ICD-10-CM

## 2012-10-18 ENCOUNTER — Emergency Department (HOSPITAL_COMMUNITY)
Admission: EM | Admit: 2012-10-18 | Discharge: 2012-10-18 | Disposition: A | Discharge status: Home / Self Care | Payer: PRIVATE HEALTH INSURANCE | Attending: Emergency Medicine | Admitting: Emergency Medicine

## 2012-10-18 DIAGNOSIS — L250 Unspecified contact dermatitis due to cosmetics: Secondary | ICD-10-CM | POA: Insufficient documentation

## 2012-10-18 DIAGNOSIS — T7840XA Allergy, unspecified, initial encounter: Secondary | ICD-10-CM

## 2012-10-18 MED ORDER — PREDNISONE 50 MG PO TABS: 50.0000 mg | ORAL_TABLET | Freq: Every day | ORAL | Status: DC

## 2012-10-18 NOTE — ED Notes (Signed)
See paper charting for initial assessment and history

## 2012-10-18 NOTE — ED Notes (Signed)
Glick MD at bedside  

## 2012-10-18 NOTE — ED Provider Notes (Signed)
History     CSN: [id number]  Arrival date & time 10/18/12  0028   None     No chief complaint on file.   (Consider location/radiation/quality/duration/timing/severity/associated sxs/prior treatment) The history is provided by the patient.   66 year old male broke out in a rash tonight. 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 He is complaining of itchiness but denies difficulty breathing or swallowing. He did have nausea without vomiting. He has also had diarrhea. He did not try to treat it at home, but he did call EMS who gave him Benadryl with relief of itching and Zofran with relief of nausea. He no longer feels like he is going to have more diarrhea. He has had similar reactions in the past to other agents.  No past medical history on file.  No past surgical history on file.  No family history on file.  History  Substance Use Topics  . Smoking status: Not on file  . Smokeless tobacco: Not on file  . Alcohol Use: Not on file      Review of Systems  All other systems reviewed and are negative.    Allergies  Review of patient's allergies indicates not on file.  Home Medications   Current Outpatient Rx  Name  Route  Sig  Dispense  Refill  . PREDNISONE 50 MG PO TABS   Oral   Take 1 tablet (50 mg total) by mouth daily.   5 tablet   0     There were no vitals taken for this visit.  Physical Exam  Nursing note and vitals reviewed. 66 year old male, resting comfortably and in no acute distress. Vital signs are normal. Oxygen saturation is 96%, which is normal. Head is normocephalic and atraumatic. PERRLA, EOMI. Oropharynx is clear. Neck is nontender and supple without adenopathy or JVD. Back is nontender and there is no CVA tenderness. Lungs are clear without rales, wheezes, or rhonchi. Chest is nontender. Heart has regular rate and rhythm without murmur. Abdomen is soft, flat, nontender without masses or hepatosplenomegaly and peristalsis  is normoactive. Extremities have no cyanosis or edema, full range of motion is present. Skin has a generalized maculopapular rash which is nonspecific in appearance. Do not see any urticarial lesions.. Neurologic: Mental status is normal, cranial nerves are intact, there are no motor or sensory deficits.   ED Course  Procedures (including critical care time)    1. Allergic reaction       MDM  Allergic reaction most likely to be to the air freshener. He will be given methylprednisolone. He has already received diphenhydramine, he will also be given famotidine.  Following the above-noted medications, his rash has cleared completely. He is sent home with prescription for prednisone and he is advised to take over-the-counter loratadine once a day for the next week.          Dione Booze, MD 10/18/12 [phone]

## 2012-10-18 NOTE — ED Notes (Signed)
Pt states understanding of discharge instruction 

## 2012-10-22 MED FILL — Famotidine in NaCl 0.9% IV Soln 20 MG/50ML: INTRAVENOUS | Qty: 50 | Status: AC

## 2012-10-22 MED FILL — Methylprednisolone Sod Succ For Inj 125 MG (Base Equiv): INTRAMUSCULAR | Qty: 1 | Status: AC

## 2013-06-12 ENCOUNTER — Encounter (HOSPITAL_BASED_OUTPATIENT_CLINIC_OR_DEPARTMENT_OTHER): Payer: Self-pay | Admitting: Radiology

## 2013-06-12 ENCOUNTER — Emergency Department (HOSPITAL_BASED_OUTPATIENT_CLINIC_OR_DEPARTMENT_OTHER): Payer: Worker's Compensation

## 2013-06-12 ENCOUNTER — Emergency Department (HOSPITAL_BASED_OUTPATIENT_CLINIC_OR_DEPARTMENT_OTHER)
Admission: EM | Admit: 2013-06-12 | Discharge: 2013-06-12 | Disposition: A | Discharge status: Home / Self Care | Payer: Worker's Compensation | Attending: Emergency Medicine | Admitting: Emergency Medicine

## 2013-06-12 DIAGNOSIS — Z951 Presence of aortocoronary bypass graft: Secondary | ICD-10-CM | POA: Insufficient documentation

## 2013-06-12 DIAGNOSIS — I2510 Atherosclerotic heart disease of native coronary artery without angina pectoris: Secondary | ICD-10-CM | POA: Insufficient documentation

## 2013-06-12 DIAGNOSIS — S20229A Contusion of unspecified back wall of thorax, initial encounter: Secondary | ICD-10-CM | POA: Insufficient documentation

## 2013-06-12 DIAGNOSIS — W19XXXA Unspecified fall, initial encounter: Secondary | ICD-10-CM

## 2013-06-12 DIAGNOSIS — Z9861 Coronary angioplasty status: Secondary | ICD-10-CM | POA: Insufficient documentation

## 2013-06-12 DIAGNOSIS — S20222A Contusion of left back wall of thorax, initial encounter: Secondary | ICD-10-CM

## 2013-06-12 DIAGNOSIS — Z7982 Long term (current) use of aspirin: Secondary | ICD-10-CM | POA: Insufficient documentation

## 2013-06-12 DIAGNOSIS — W1809XA Striking against other object with subsequent fall, initial encounter: Secondary | ICD-10-CM | POA: Insufficient documentation

## 2013-06-12 DIAGNOSIS — I10 Essential (primary) hypertension: Secondary | ICD-10-CM | POA: Insufficient documentation

## 2013-06-12 DIAGNOSIS — Z79899 Other long term (current) drug therapy: Secondary | ICD-10-CM | POA: Insufficient documentation

## 2013-06-12 DIAGNOSIS — Y939 Activity, unspecified: Secondary | ICD-10-CM | POA: Insufficient documentation

## 2013-06-12 DIAGNOSIS — Z8719 Personal history of other diseases of the digestive system: Secondary | ICD-10-CM | POA: Insufficient documentation

## 2013-06-12 DIAGNOSIS — Y929 Unspecified place or not applicable: Secondary | ICD-10-CM | POA: Insufficient documentation

## 2013-06-12 DIAGNOSIS — Z8679 Personal history of other diseases of the circulatory system: Secondary | ICD-10-CM | POA: Insufficient documentation

## 2013-06-12 MED ORDER — IBUPROFEN 400 MG PO TABS
600.0000 mg | ORAL_TABLET | Freq: Once | ORAL | Status: AC
  Administered 2013-06-12: 600 mg via ORAL
  Filled 2013-06-12: qty 1

## 2013-06-12 NOTE — ED Notes (Signed)
Pt presents with iinjuries r/t fall. Injuring back and right leg

## 2013-06-12 NOTE — ED Notes (Signed)
Patient transported to X-ray 

## 2013-06-21 NOTE — ED Provider Notes (Signed)
History    67 year old male with back and side pain after fall. Patient slipped as prior to arrival and fell onto his left side. Is not taking his head. Denies any headache. Denies any neck pain. Pain is achy and constant. Worse range of motion of his left shoulder. No numbness, tingling or loss of strength. No change in visual acuity. No intervention prior to arrival. Has been ambulating since the fall without difficulty.  CSN: [id number] Arrival date & time 06/12/13  9147  First MD Initiated Contact with Patient 06/12/13 [phone]     Chief Complaint  Patient presents with  . Fall   (Consider location/radiation/quality/duration/timing/severity/associated sxs/prior Treatment) HPI Past Medical History  Diagnosis Date  . Coronary artery disease   . Angina   . Hypertension   . GERD (gastroesophageal reflux disease)    Past Surgical History  Procedure Laterality Date  . Coronary artery bypass graft    . Coronary angioplasty with stent placement    . Hernia repair    . Tonsillectomy    . Knee arthroscopy     History reviewed. No pertinent family history. History  Substance Use Topics  . Smoking status: Never Smoker   . Smokeless tobacco: Never Used  . Alcohol Use: No    Review of Systems  All systems reviewed and negative, other than as noted in HPI.   Allergies  Other  Home Medications   Current Outpatient Rx  Name  Route  Sig  Dispense  Refill  . EXPIRED: amLODipine (NORVASC) 5 MG tablet   Oral   Take 1 tablet (5 mg total) by mouth daily.   30 tablet   3   . amLODipine (NORVASC) 5 MG tablet   Oral   Take 5 mg by mouth daily.         Marland Kitchen aspirin EC 81 MG tablet   Oral   Take 81 mg by mouth daily.         Marland Kitchen aspirin EC 81 MG tablet   Oral   Take 81 mg by mouth at bedtime.         . fish oil-omega-3 fatty acids 1000 MG capsule   Oral   Take 2-4 g by mouth daily. 2 in a.m. And 1 at lunch and 1 at dinner         . metoprolol succinate (TOPROL-XL) 100  MG 24 hr tablet   Oral   Take 100 mg by mouth daily. Take with or immediately following a meal.         . metoprolol succinate (TOPROL-XL) 50 MG 24 hr tablet   Oral   Take 1 tablet (50 mg total) by mouth daily. Patient takes at lunchtime Take with or immediately following a meal.   3 tablet   30   . EXPIRED: nitroGLYCERIN (NITROSTAT) 0.4 MG SL tablet   Sublingual   Place 1 tablet (0.4 mg total) under the tongue every 5 (five) minutes x 3 doses as needed for chest pain.   25 tablet   3   . nitroGLYCERIN (NITROSTAT) 0.4 MG SL tablet   Sublingual   Place 0.4 mg under the tongue every 5 (five) minutes as needed. For chest pain         . Omega-3 Fatty Acids (FISH OIL PO)   Oral   Take 1-2 capsules by mouth 3 (three) times daily. Patient takes 1 in the morning, 1 at lunchtime, 2 with dinner         .  pravastatin (PRAVACHOL) 40 MG tablet   Oral   Take 40 mg by mouth every evening.         . predniSONE (DELTASONE) 50 MG tablet   Oral   Take 1 tablet (50 mg total) by mouth daily.   5 tablet   0    BP 140/96  Pulse 71  Temp(Src) 97.6 F (36.4 C) (Oral)  Resp 18  Ht 5\' 5"  (1.651 m)  Wt 146 lb (66.225 kg)  BMI 24.3 kg/m2  SpO2 98% Physical Exam  Nursing note and vitals reviewed. Constitutional: He is oriented to person, place, and time. He appears well-developed and well-nourished. No distress.  HENT:  Head: Normocephalic and atraumatic.  Eyes: Conjunctivae are normal. Pupils are equal, round, and reactive to light. Right eye exhibits no discharge. Left eye exhibits no discharge.  Neck: Neck supple.  Cardiovascular: Normal rate, regular rhythm and normal heart sounds.  Exam reveals no gallop and no friction rub.   No murmur heard. Pulmonary/Chest: Effort normal and breath sounds normal. No respiratory distress.  Abdominal: Soft. He exhibits no distension. There is no tenderness.  Musculoskeletal: He exhibits no edema and no tenderness.  No midline spinal  tenderness. Some mild tenderness to palpation the mid to upper thoracic region paraspinally on the left and into the left scapular region. Tenderness extends into the left axilla. No tenderness. No skin changes.  Neurological: He is alert and oriented to person, place, and time. No cranial nerve deficit. He exhibits normal muscle tone. Coordination normal.  Skin: Skin is warm and dry.  Psychiatric: He has a normal mood and affect. His behavior is normal. Thought content normal.    ED Course  Procedures (including critical care time) Labs Reviewed - No data to display No results found.  Dg Chest 2 View  06/12/2013   *RADIOLOGY REPORT*  Clinical Data: Patient fell lateralize back this morning.  Pain in the upper posterior back.  CHEST - 2 VIEW  Comparison: 03/30/2012.  Findings: The heart, mediastinum and hilar contours are normal. Status post CABG.  The lungs are clear.  No contusion, effusion or pneumothorax.  The bony thorax is intact.  No definite evidence of acute trauma.  Slight compression of the T12 vertebrae unchanged from prior study. The bony thorax is otherwise normal.  IMPRESSION: No acute findings.  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  No acute bony changes.   Original Report Authenticated By: Sander Radon, M.D.   Dg Thoracic Spine W/swimmers  06/12/2013   *RADIOLOGY REPORT*  Clinical Data: Fall.  Back pain.  THORACIC SPINE - 2 VIEW + SWIMMERS  Comparison: Two-view chest x-ray 03/30/2012  Findings: 12 rib-bearing thoracic type vertebral bodies are present.  Minimal anterior wedging at to T12 is stable.  No acute fracture or traumatic subluxation is present.  Mild degenerative changes are present in the cervical spine.  Thoracic kyphosis is somewhat exaggerated.  The patient is status post median sternotomy for CABG appear  IMPRESSION: A 1.  No acute abnormality or significant interval change. 2.  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   Original Report  Authenticated By: Marin Roberts, M.D.  1. Fall, initial encounter   2. Back contusion, left, initial encounter     MDM  67 year old male with some back and left axillary pain after fall. Suspect muscular strain or contusion. Nonfocal neurological examination. Asymptomatic treatment. Return precautions discussed.  Raeford Razor, MD 06/21/13 [phone]

## 2014-05-23 ENCOUNTER — Other Ambulatory Visit (HOSPITAL_COMMUNITY): Payer: Self-pay | Admitting: Cardiology

## 2014-05-23 DIAGNOSIS — R079 Chest pain, unspecified: Secondary | ICD-10-CM

## 2014-05-30 ENCOUNTER — Encounter (HOSPITAL_COMMUNITY)
Admission: RE | Admit: 2014-05-30 | Discharge: 2014-05-30 | Disposition: A | Discharge status: Home / Self Care | Payer: PRIVATE HEALTH INSURANCE | Source: Ambulatory Visit | Attending: Cardiology | Admitting: Cardiology

## 2014-05-30 ENCOUNTER — Encounter (INDEPENDENT_AMBULATORY_CARE_PROVIDER_SITE_OTHER): Payer: Self-pay

## 2014-05-30 DIAGNOSIS — R079 Chest pain, unspecified: Secondary | ICD-10-CM

## 2014-05-30 MED ORDER — TECHNETIUM TC 99M SESTAMIBI - CARDIOLITE
10.0000 | Freq: Once | INTRAVENOUS | Status: AC | PRN
  Administered 2014-05-30: 10 via INTRAVENOUS

## 2014-05-30 MED ORDER — TECHNETIUM TC 99M SESTAMIBI GENERIC - CARDIOLITE
30.0000 | Freq: Once | INTRAVENOUS | Status: AC | PRN
  Administered 2014-05-30: 30 via INTRAVENOUS

## 2014-05-30 MED ORDER — REGADENOSON 0.4 MG/5ML IV SOLN
0.4000 mg | Freq: Once | INTRAVENOUS | Status: AC
  Administered 2014-05-30: 0.4 mg via INTRAVENOUS

## 2014-05-30 MED ORDER — REGADENOSON 0.4 MG/5ML IV SOLN
INTRAVENOUS | Status: AC
  Filled 2014-05-30: qty 5

## 2014-11-24 ENCOUNTER — Encounter (HOSPITAL_COMMUNITY): Payer: Self-pay | Admitting: Cardiology

## 2014-12-08 ENCOUNTER — Encounter (HOSPITAL_COMMUNITY): Payer: Self-pay | Admitting: Emergency Medicine

## 2014-12-08 ENCOUNTER — Emergency Department (HOSPITAL_COMMUNITY)
Admission: EM | Admit: 2014-12-08 | Discharge: 2014-12-08 | Disposition: A | Discharge status: Home / Self Care | Payer: PRIVATE HEALTH INSURANCE | Attending: Emergency Medicine | Admitting: Emergency Medicine

## 2014-12-08 DIAGNOSIS — Z7952 Long term (current) use of systemic steroids: Secondary | ICD-10-CM | POA: Insufficient documentation

## 2014-12-08 DIAGNOSIS — Z951 Presence of aortocoronary bypass graft: Secondary | ICD-10-CM | POA: Insufficient documentation

## 2014-12-08 DIAGNOSIS — X58XXXA Exposure to other specified factors, initial encounter: Secondary | ICD-10-CM | POA: Diagnosis not present

## 2014-12-08 DIAGNOSIS — Y998 Other external cause status: Secondary | ICD-10-CM | POA: Diagnosis not present

## 2014-12-08 DIAGNOSIS — Y9389 Activity, other specified: Secondary | ICD-10-CM | POA: Diagnosis not present

## 2014-12-08 DIAGNOSIS — L299 Pruritus, unspecified: Secondary | ICD-10-CM | POA: Diagnosis present

## 2014-12-08 DIAGNOSIS — T7840XA Allergy, unspecified, initial encounter: Secondary | ICD-10-CM | POA: Diagnosis not present

## 2014-12-08 DIAGNOSIS — Z79899 Other long term (current) drug therapy: Secondary | ICD-10-CM | POA: Insufficient documentation

## 2014-12-08 DIAGNOSIS — I25119 Atherosclerotic heart disease of native coronary artery with unspecified angina pectoris: Secondary | ICD-10-CM | POA: Diagnosis not present

## 2014-12-08 DIAGNOSIS — Y9289 Other specified places as the place of occurrence of the external cause: Secondary | ICD-10-CM | POA: Diagnosis not present

## 2014-12-08 DIAGNOSIS — Z8719 Personal history of other diseases of the digestive system: Secondary | ICD-10-CM | POA: Insufficient documentation

## 2014-12-08 DIAGNOSIS — F419 Anxiety disorder, unspecified: Secondary | ICD-10-CM | POA: Diagnosis not present

## 2014-12-08 DIAGNOSIS — Z7902 Long term (current) use of antithrombotics/antiplatelets: Secondary | ICD-10-CM | POA: Insufficient documentation

## 2014-12-08 DIAGNOSIS — Z7982 Long term (current) use of aspirin: Secondary | ICD-10-CM | POA: Diagnosis not present

## 2014-12-08 DIAGNOSIS — I10 Essential (primary) hypertension: Secondary | ICD-10-CM | POA: Insufficient documentation

## 2014-12-08 MED ORDER — ONDANSETRON HCL 4 MG/2ML IJ SOLN
4.0000 mg | Freq: Once | INTRAMUSCULAR | Status: AC
  Administered 2014-12-08: 4 mg via INTRAVENOUS
  Filled 2014-12-08: qty 2

## 2014-12-08 MED ORDER — DEXAMETHASONE SODIUM PHOSPHATE 10 MG/ML IJ SOLN
10.0000 mg | Freq: Once | INTRAMUSCULAR | Status: AC
  Administered 2014-12-08: 10 mg via INTRAVENOUS
  Filled 2014-12-08: qty 1

## 2014-12-08 NOTE — ED Notes (Signed)
Pt BIB EMS, c/o allergic reaction with rash itching and difficulty breathing. Pt initially took 25mg  Benadryl PO, but symptoms increased. EMS gave 50mg  Benadryl IM and Zantac IV. Pt now denies symptoms. Pt a&ox4, skin warm and dry. VSS

## 2014-12-08 NOTE — ED Provider Notes (Signed)
CSN: [id number]     Arrival date & time 12/08/14  0422 History   First MD Initiated Contact with Patient 12/08/14 0425     Chief Complaint  Patient presents with  . Allergic Reaction     (Consider location/radiation/quality/duration/timing/severity/associated sxs/prior Treatment) HPI  This is a 68 year old male who woke up about 1 hour prior to arrival with generalized itching. He took 25 milligrams of Benadryl and called EMS. EMS found him to have severe, generalized hives and administered 50 milligrams of Benadryl IM and 50 milligrams of Zantac IV. He was having shortness of breath but states this may be due to anxiety. EMS reports no wheezing or throat swelling. He has subsequently nearly completely resolved. He is complaining of mild nausea. He had no vomiting or diarrhea. He does not know what allergen may have triggered this reaction. He is complaining of a dry mouth.  Past Medical History  Diagnosis Date  . Coronary artery disease   . Angina   . Hypertension   . GERD (gastroesophageal reflux disease)    Past Surgical History  Procedure Laterality Date  . Coronary artery bypass graft    . Coronary angioplasty with stent placement    . Hernia repair    . Tonsillectomy    . Knee arthroscopy    . Left heart catheterization with coronary angiogram N/A 03/31/2012    Procedure: LEFT HEART CATHETERIZATION WITH CORONARY ANGIOGRAM;  Surgeon: Robynn PaneMohan N Harwani, MD;  [facility];  Service: Cardiovascular;  Laterality: N/A;   History reviewed. No pertinent family history. History  Substance Use Topics  . Smoking status: Never Smoker   . Smokeless tobacco: Never Used  . Alcohol Use: No    Review of Systems  All other systems reviewed and are negative.   Allergies  Other  Home Medications   Prior to Admission medications   Medication Sig Start Date End Date Taking? Authorizing Provider  acetaminophen (TYLENOL) 325 MG tablet Take 325 mg by mouth every 6 (six) hours as  needed for headache.   Yes Historical Provider, MD  amLODipine (NORVASC) 5 MG tablet Take 5 mg by mouth daily.   Yes Historical Provider, MD  aspirin EC 81 MG tablet Take 81 mg by mouth at bedtime.   Yes Historical Provider, MD  clopidogrel (PLAVIX) 75 MG tablet Take 75 mg by mouth daily.   Yes Historical Provider, MD  diphenhydrAMINE (BENADRYL) 25 MG tablet Take 25 mg by mouth every 6 (six) hours as needed for itching.   Yes Historical Provider, MD  doxazosin (CARDURA) 1 MG tablet Take 1 mg by mouth daily.   Yes Historical Provider, MD  metoprolol succinate (TOPROL-XL) 50 MG 24 hr tablet Take 1 tablet (50 mg total) by mouth daily. Patient takes at lunchtime Take with or immediately following a meal. 04/01/12  Yes Robynn PaneMohan N Harwani, MD  Multiple Vitamin (MULTIVITAMIN WITH MINERALS) TABS tablet Take 1 tablet by mouth daily.   Yes Historical Provider, MD  nitroGLYCERIN (NITROSTAT) 0.4 MG SL tablet Place 0.4 mg under the tongue every 5 (five) minutes as needed. For chest pain   Yes Historical Provider, MD  Omega-3 Fatty Acids (FISH OIL PO) Take 1-2 capsules by mouth 3 (three) times daily. Patient takes 1 in the morning, 1 at lunchtime, 2 with dinner   Yes Historical Provider, MD  pravastatin (PRAVACHOL) 40 MG tablet Take 40 mg by mouth every evening.   Yes Historical Provider, MD  amLODipine (NORVASC) 5 MG tablet Take 1 tablet (  5 mg total) by mouth daily. 04/01/12 04/01/13  Robynn PaneMohan N Harwani, MD  aspirin EC 81 MG tablet Take 81 mg by mouth daily.    Historical Provider, MD  nitroGLYCERIN (NITROSTAT) 0.4 MG SL tablet Place 1 tablet (0.4 mg total) under the tongue every 5 (five) minutes x 3 doses as needed for chest pain. 04/01/12 04/01/13  Robynn PaneMohan N Harwani, MD  predniSONE (DELTASONE) 50 MG tablet Take 1 tablet (50 mg total) by mouth daily. 10/18/12   Dione Boozeavid Glick, MD   BP 145/68 mmHg  Pulse 59  Temp(Src) 98.6 F (37 C) (Oral)  Resp 18  Ht 5\' 3"  (1.6 m)  Wt 146 lb (66.225 kg)  BMI 25.87 kg/m2  SpO2 97%    Physical Exam General: Well-developed, well-nourished male in no acute distress; appearance consistent with age of record HENT: normocephalic; atraumatic; no dysphonia Eyes: pupils equal, round and reactive to light; extraocular muscles intact Neck: supple Heart: regular rate and rhythm Lungs: clear to auscultation bilaterally Abdomen: soft; nondistended; nontender; bowel sounds present Extremities: No deformity; full range of motion; pulses normal Neurologic: Awake, alert and oriented; motor function intact in all extremities and symmetric; no facial droop Skin: Warm and dry; no rash seen Psychiatric: Mildly anxious   ED Course  Procedures (including critical care time)   MDM   5:55 AM Patient is asymptomatic. He was advised to take Benadryl 50 milligrams every 6 hours as needed should symptoms recur.    Hanley SeamenJohn L Safira Proffit, MD 12/08/14 [phone]

## 2014-12-08 NOTE — ED Notes (Signed)
Bed: [id number] Expected date: 12/08/14 Expected time: 4:00 AM Means of arrival: Ambulance Comments: 68 yo M  Allergic reaction

## 2015-07-17 ENCOUNTER — Other Ambulatory Visit: Payer: Self-pay | Admitting: Family Medicine

## 2015-07-17 ENCOUNTER — Ambulatory Visit
Admission: RE | Admit: 2015-07-17 | Discharge: 2015-07-17 | Disposition: A | Discharge status: Home / Self Care | Payer: PRIVATE HEALTH INSURANCE | Source: Ambulatory Visit | Attending: Family Medicine | Admitting: Family Medicine

## 2015-07-17 DIAGNOSIS — M25561 Pain in right knee: Secondary | ICD-10-CM

## 2016-04-18 ENCOUNTER — Other Ambulatory Visit: Payer: Self-pay | Admitting: Family Medicine

## 2016-04-18 ENCOUNTER — Ambulatory Visit
Admission: RE | Admit: 2016-04-18 | Discharge: 2016-04-18 | Disposition: A | Discharge status: Home / Self Care | Payer: BLUE CROSS/BLUE SHIELD | Source: Ambulatory Visit | Attending: Family Medicine | Admitting: Family Medicine

## 2016-04-18 DIAGNOSIS — R52 Pain, unspecified: Secondary | ICD-10-CM

## 2016-04-24 ENCOUNTER — Other Ambulatory Visit: Payer: Self-pay | Admitting: Family Medicine

## 2016-04-24 ENCOUNTER — Ambulatory Visit
Admission: RE | Admit: 2016-04-24 | Discharge: 2016-04-24 | Disposition: A | Discharge status: Home / Self Care | Payer: BLUE CROSS/BLUE SHIELD | Source: Ambulatory Visit | Attending: Family Medicine | Admitting: Family Medicine

## 2016-04-24 DIAGNOSIS — M5412 Radiculopathy, cervical region: Secondary | ICD-10-CM

## 2016-07-29 ENCOUNTER — Other Ambulatory Visit: Payer: Self-pay | Admitting: Cardiology

## 2016-07-29 DIAGNOSIS — R079 Chest pain, unspecified: Secondary | ICD-10-CM

## 2016-08-07 ENCOUNTER — Encounter (HOSPITAL_COMMUNITY)
Admission: RE | Admit: 2016-08-07 | Discharge: 2016-08-07 | Disposition: A | Discharge status: Home / Self Care | Payer: BLUE CROSS/BLUE SHIELD | Source: Ambulatory Visit | Attending: Cardiology | Admitting: Cardiology

## 2016-08-07 DIAGNOSIS — R079 Chest pain, unspecified: Secondary | ICD-10-CM | POA: Diagnosis present

## 2016-08-07 MED ORDER — REGADENOSON 0.4 MG/5ML IV SOLN
INTRAVENOUS | Status: AC
  Administered 2016-08-07: 0.4 mg via INTRAVENOUS
  Filled 2016-08-07: qty 5

## 2016-08-07 MED ORDER — TECHNETIUM TC 99M TETROFOSMIN IV KIT
10.0000 | PACK | Freq: Once | INTRAVENOUS | Status: AC | PRN
  Administered 2016-08-07: 10 via INTRAVENOUS

## 2016-08-07 MED ORDER — REGADENOSON 0.4 MG/5ML IV SOLN
0.4000 mg | Freq: Once | INTRAVENOUS | Status: AC
  Administered 2016-08-07: 0.4 mg via INTRAVENOUS

## 2016-08-07 MED ORDER — TECHNETIUM TC 99M TETROFOSMIN IV KIT
30.0000 | PACK | Freq: Once | INTRAVENOUS | Status: AC | PRN
  Administered 2016-08-07: 30 via INTRAVENOUS

## 2016-12-15 ENCOUNTER — Encounter (HOSPITAL_COMMUNITY): Payer: Self-pay | Admitting: Oncology

## 2016-12-15 ENCOUNTER — Emergency Department (HOSPITAL_COMMUNITY)
Admission: EM | Admit: 2016-12-15 | Discharge: 2016-12-15 | Disposition: A | Discharge status: Home / Self Care | Payer: BLUE CROSS/BLUE SHIELD | Attending: Emergency Medicine | Admitting: Emergency Medicine

## 2016-12-15 DIAGNOSIS — Z955 Presence of coronary angioplasty implant and graft: Secondary | ICD-10-CM | POA: Insufficient documentation

## 2016-12-15 DIAGNOSIS — I10 Essential (primary) hypertension: Secondary | ICD-10-CM | POA: Insufficient documentation

## 2016-12-15 DIAGNOSIS — I2510 Atherosclerotic heart disease of native coronary artery without angina pectoris: Secondary | ICD-10-CM | POA: Insufficient documentation

## 2016-12-15 DIAGNOSIS — Z7982 Long term (current) use of aspirin: Secondary | ICD-10-CM | POA: Insufficient documentation

## 2016-12-15 DIAGNOSIS — Z951 Presence of aortocoronary bypass graft: Secondary | ICD-10-CM | POA: Insufficient documentation

## 2016-12-15 DIAGNOSIS — Z79899 Other long term (current) drug therapy: Secondary | ICD-10-CM | POA: Diagnosis not present

## 2016-12-15 DIAGNOSIS — T7840XA Allergy, unspecified, initial encounter: Secondary | ICD-10-CM | POA: Diagnosis not present

## 2016-12-15 MED ORDER — FAMOTIDINE IN NACL 20-0.9 MG/50ML-% IV SOLN
20.0000 mg | INTRAVENOUS | Status: AC
  Administered 2016-12-15: 20 mg via INTRAVENOUS
  Filled 2016-12-15: qty 50

## 2016-12-15 MED ORDER — EPINEPHRINE 0.3 MG/0.3ML IJ SOAJ: 0.3000 mg | Freq: Once | INTRAMUSCULAR | 1 refills | Status: DC | PRN

## 2016-12-15 MED ORDER — METHYLPREDNISOLONE SODIUM SUCC 125 MG IJ SOLR
125.0000 mg | Freq: Once | INTRAMUSCULAR | Status: AC
  Administered 2016-12-15: 125 mg via INTRAVENOUS
  Filled 2016-12-15: qty 2

## 2016-12-15 NOTE — ED Triage Notes (Signed)
Pt bib [facility] from home d/t allergic reaction.  Per EMS pt reported that at approximately 2230 he noticed a rash, hives, Shob and nausea.  Pt states this has happened twice in the past.  Pt states he is unsure as to the cause of the allergic reactions.  No new soaps, dietary products, or changes in his normal routine.  Pt given 0.3 mg of Epi en route as well as 25 mg IV benadryl.  Pt reported taking 50 mg of benadryl PO at home however it expired in 2015.  Per EMS pt's rash has gotten significantly better.  Airway intact.  Respirations even and unlabored.

## 2016-12-15 NOTE — Discharge Instructions (Signed)
Use an EpiPen if you develop worsening symptoms with associated difficulty breathing or swallowing. We have decided to withhold prednisone given that you are on [facility] wort. Instead, we recommend taking one tablet of Benadryl every 6 hours as needed for persistent hives or itching. Follow-up with your primary care doctor regarding your visit today. We advise they refer you to an allergist for allergy testing. Return to the ED for new or concerning symptoms.

## 2016-12-15 NOTE — ED Provider Notes (Signed)
[facility]-EMERGENCY DEPT Provider Note    By signing my name below, I, [name], attest that this documentation has been prepared under the direction and in the presence of TRW AutomotiveKelly Bertina Guthridge, PA-C. Electronically Signed: Earmon PhoenixJennifer Wheeler, ED Scribe. 12/15/16. 2:49 AM.   History   Chief Complaint Chief Complaint  Patient presents with  . Allergic Reaction   The history is provided by the patient and medical records. No language interpreter was used.    HPI Comments:  [name] is a 70 y.o. male brought in by EMS, who presents to the Emergency Department complaining of an allergic reaction that began PTA. He reports he was at home when he he began developing itchy hives to his extremities. He reports associated mild SOB and nausea. He reports taking two Benadryl tablets before calling EMS but they expired two years ago. He was given Epinephrine by EMS and states his symptoms have improved since. He denies any new soaps, lotions, creams, detergents or any other personal hygiene products. He denies difficulty swallowing or sensation of throat closing, vomiting or any pain. Pt states he has had similar reactions to various irritants in the past.    Past Medical History:  Diagnosis Date  . Angina   . Coronary artery disease   . GERD (gastroesophageal reflux disease)   . Hypertension     Patient Active Problem List   Diagnosis Date Noted  . Acute coronary syndrome (HCC) 03/30/2012    Past Surgical History:  Procedure Laterality Date  . CORONARY ANGIOPLASTY WITH STENT PLACEMENT    . CORONARY ARTERY BYPASS GRAFT    . HERNIA REPAIR    . KNEE ARTHROSCOPY    . LEFT HEART CATHETERIZATION WITH CORONARY ANGIOGRAM N/A 03/31/2012   Procedure: LEFT HEART CATHETERIZATION WITH CORONARY ANGIOGRAM;  Surgeon: Robynn PaneMohan N Harwani, MD;  Location: [facility] CATH LAB;  Service: Cardiovascular;  Laterality: N/A;  . TONSILLECTOMY         Home Medications    Prior to Admission medications   Medication  Sig Start Date End Date Taking? Authorizing Provider  acetaminophen (TYLENOL) 325 MG tablet Take 325 mg by mouth every 6 (six) hours as needed for headache.   Yes Historical Provider, MD  aspirin EC 81 MG tablet Take 81 mg by mouth at bedtime.    Yes Historical Provider, MD  clopidogrel (PLAVIX) 75 MG tablet Take 75 mg by mouth daily.   Yes Historical Provider, MD  doxazosin (CARDURA) 1 MG tablet Take 1 mg by mouth daily.   Yes Historical Provider, MD  meclizine (ANTIVERT) 25 MG tablet Take 25 mg by mouth 3 (three) times daily as needed for dizziness.   Yes Historical Provider, MD  metoprolol succinate (TOPROL-XL) 50 MG 24 hr tablet Take 1 tablet (50 mg total) by mouth daily. Patient takes at lunchtime Take with or immediately following a meal. 04/01/12  Yes Rinaldo CloudMohan Harwani, MD  Multiple Vitamin (MULTIVITAMIN WITH MINERALS) TABS tablet Take 1 tablet by mouth daily.   Yes Historical Provider, MD  nitroGLYCERIN (NITROSTAT) 0.4 MG SL tablet Place 0.4 mg under the tongue every 5 (five) minutes as needed. For chest pain   Yes Historical Provider, MD  Omega-3 Fatty Acids (FISH OIL PO) Take 1 capsule by mouth 2 (two) times daily. Patient takes 1 in the morning, 1 at lunchtime, 2 with dinner    Yes Historical Provider, MD  pravastatin (PRAVACHOL) 40 MG tablet Take 40 mg by mouth every evening.   Yes Historical Provider, MD  ranitidine (  ZANTAC) 150 MG tablet Take 150 mg by mouth 2 (two) times daily as needed for heartburn.   Yes Historical Provider, MD  [facility] Wort ([employer] [facility] WORT) 150 MG CAPS Take 300 mg by mouth every morning.   Yes Historical Provider, MD  EPINEPHrine (EPIPEN 2-PAK) 0.3 mg/0.3 mL IJ SOAJ injection Inject 0.3 mLs (0.3 mg total) into the muscle once as needed (for severe allergic reaction). CAll 911 immediately if you have to use this medicine 12/15/16   Antony MaduraKelly Morley Gaumer, PA-C    Family History No family history on file.  Social History Social History  Substance Use Topics  . Smoking  status: Never Smoker  . Smokeless tobacco: Never Used  . Alcohol use No     Allergies   Other   Review of Systems Review of Systems  A complete 10 system review of systems was obtained and all systems are negative except as noted in the HPI and PMH.     Physical Exam Updated Vital Signs BP 153/82 (BP Location: Left Arm)   Pulse 79   Temp 97.7 F (36.5 C) (Oral)   Resp 20   Ht 5\' 3"  (1.6 m)   Wt 148 lb (67.1 kg)   SpO2 95%   BMI 26.22 kg/m   Physical Exam  Constitutional: He is oriented to person, place, and time. He appears well-developed and well-nourished. No distress.  Nontoxic appearing and pleasant.  HENT:  Head: Normocephalic and atraumatic.  Mouth/Throat: Oropharynx is clear and moist.  No angioedema. Oropharynx clear. Uvula midline. Patient tolerating secretions without difficulty.  Eyes: Conjunctivae and EOM are normal. No scleral icterus.  Neck: Normal range of motion.  Cardiovascular: Normal rate, regular rhythm and intact distal pulses.   Pulmonary/Chest: Effort normal. No respiratory distress. He has no wheezes. He has no rales.  Lungs clear bilaterally. No stridor.  Musculoskeletal: Normal range of motion.  Neurological: He is alert and oriented to person, place, and time.  Skin: Skin is warm and dry. Rash noted. He is not diaphoretic. No erythema. No pallor.  Resolving urticaria to bilateral forearms and inner thighs.  Psychiatric: He has a normal mood and affect. His behavior is normal.  Nursing note and vitals reviewed.    ED Treatments / Results  DIAGNOSTIC STUDIES: Oxygen Saturation is 95% on RA, adequate by my interpretation.   COORDINATION OF CARE: 2:20 AM- Will order IV Pepcid and Solu-Medrol. Will prescribe EpiPen prior to discharge. Pt verbalizes understanding and agrees to plan.  Medications  methylPREDNISolone sodium succinate (SOLU-MEDROL) 125 mg/2 mL injection 125 mg (125 mg Intravenous Given 12/15/16 0222)  famotidine (PEPCID)  IVPB 20 mg premix (0 mg Intravenous Stopped 12/15/16 0325)    Labs (all labs ordered are listed, but only abnormal results are displayed) Labs Reviewed - No data to display  EKG  EKG Interpretation None       Radiology No results found.  Procedures Procedures (including critical care time)  Medications Ordered in ED Medications  methylPREDNISolone sodium succinate (SOLU-MEDROL) 125 mg/2 mL injection 125 mg (125 mg Intravenous Given 12/15/16 0222)  famotidine (PEPCID) IVPB 20 mg premix (0 mg Intravenous Stopped 12/15/16 0325)     Initial Impression / Assessment and Plan / ED Course  I have reviewed the triage vital signs and the nursing notes.  Pertinent labs & imaging results that were available during my care of the patient were reviewed by me and considered in my medical decision making (see chart for details).  Clinical Course  70 year old male presents to the emergency department for evaluation of allergic reaction and urticaria. Patient denies new ingestions or topical contacts. He has had similar episodes in the past, the last of which was 2 years ago. Patient required an EpiPen which was administered by EMS. He was also given 25 mg of Benadryl prior to arrival. Management supplemented with Solu-Medrol and Pepcid. The patient has been monitored for 3.5 hours in the emergency department with no evidence of recurrent reaction. I do not believe further emergent workup is indicated. Plan to discharge with prescription for EpiPen. Primary care follow-up advised and return precautions given. Patient discharged in stable condition with no unaddressed concerns.  I personally performed the services described in this documentation, which was scribed in my presence. The recorded information has been reviewed and is accurate.    Final Clinical Impressions(s) / ED Diagnoses   Final diagnoses:  Allergic reaction, initial encounter    New Prescriptions New Prescriptions    EPINEPHRINE (EPIPEN 2-PAK) 0.3 MG/0.3 ML IJ SOAJ INJECTION    Inject 0.3 mLs (0.3 mg total) into the muscle once as needed (for severe allergic reaction). CAll 911 immediately if you have to use this medicine     Antony Madura, PA-C 12/15/16 1610    Tomasita Crumble, MD 12/15/16 1359

## 2017-03-25 ENCOUNTER — Emergency Department (HOSPITAL_COMMUNITY)
Admission: EM | Admit: 2017-03-25 | Discharge: 2017-03-25 | Disposition: A | Discharge status: Home / Self Care | Payer: BLUE CROSS/BLUE SHIELD | Attending: Emergency Medicine | Admitting: Emergency Medicine

## 2017-03-25 ENCOUNTER — Emergency Department (HOSPITAL_COMMUNITY): Payer: BLUE CROSS/BLUE SHIELD

## 2017-03-25 DIAGNOSIS — Z955 Presence of coronary angioplasty implant and graft: Secondary | ICD-10-CM | POA: Insufficient documentation

## 2017-03-25 DIAGNOSIS — I10 Essential (primary) hypertension: Secondary | ICD-10-CM | POA: Diagnosis not present

## 2017-03-25 DIAGNOSIS — R072 Precordial pain: Secondary | ICD-10-CM | POA: Diagnosis not present

## 2017-03-25 DIAGNOSIS — Z79899 Other long term (current) drug therapy: Secondary | ICD-10-CM | POA: Diagnosis not present

## 2017-03-25 DIAGNOSIS — Z8679 Personal history of other diseases of the circulatory system: Secondary | ICD-10-CM

## 2017-03-25 DIAGNOSIS — R0789 Other chest pain: Secondary | ICD-10-CM | POA: Diagnosis present

## 2017-03-25 DIAGNOSIS — F419 Anxiety disorder, unspecified: Secondary | ICD-10-CM | POA: Insufficient documentation

## 2017-03-25 DIAGNOSIS — Z7982 Long term (current) use of aspirin: Secondary | ICD-10-CM | POA: Diagnosis not present

## 2017-03-25 DIAGNOSIS — I2510 Atherosclerotic heart disease of native coronary artery without angina pectoris: Secondary | ICD-10-CM | POA: Diagnosis not present

## 2017-03-25 LAB — CBC WITH DIFFERENTIAL/PLATELET
BASOS PCT: 1 %
Basophils Absolute: 0 10*3/uL (ref 0.0–0.1)
Eosinophils Absolute: 0.1 10*3/uL (ref 0.0–0.7)
Eosinophils Relative: 2 %
HEMATOCRIT: 45.6 % (ref 39.0–52.0)
Hemoglobin: 16.2 g/dL (ref 13.0–17.0)
Lymphocytes Relative: 24 %
Lymphs Abs: 1.5 10*3/uL (ref 0.7–4.0)
MCH: 34 pg (ref 26.0–34.0)
MCHC: 35.5 g/dL (ref 30.0–36.0)
MCV: 95.8 fL (ref 78.0–100.0)
Monocytes Absolute: 0.6 10*3/uL (ref 0.1–1.0)
Monocytes Relative: 9 %
NEUTROS ABS: 4 10*3/uL (ref 1.7–7.7)
NEUTROS PCT: 64 %
Platelets: 193 10*3/uL (ref 150–400)
RBC: 4.76 MIL/uL (ref 4.22–5.81)
RDW: 12.5 % (ref 11.5–15.5)
WBC: 6.2 10*3/uL (ref 4.0–10.5)

## 2017-03-25 LAB — BASIC METABOLIC PANEL
Anion gap: 7 (ref 5–15)
BUN: 14 mg/dL (ref 6–20)
CHLORIDE: 108 mmol/L (ref 101–111)
CO2: 23 mmol/L (ref 22–32)
CREATININE: 1.03 mg/dL (ref 0.61–1.24)
Calcium: 9 mg/dL (ref 8.9–10.3)
Glucose, Bld: 120 mg/dL — ABNORMAL HIGH (ref 65–99)
Potassium: 4.3 mmol/L (ref 3.5–5.1)
SODIUM: 138 mmol/L (ref 135–145)

## 2017-03-25 LAB — I-STAT TROPONIN, ED: TROPONIN I, POC: 0 ng/mL (ref 0.00–0.08)

## 2017-03-25 NOTE — ED Notes (Signed)
ED Provider at bedside. 

## 2017-03-25 NOTE — ED Provider Notes (Signed)
Signed out by Dr Wilkie Aye at 0730.    On recheck, no chest pain or discomfort. Pt states feels fine. Is ambulatory in hall, no cp or sob.    Pt notes symptoms began yesterday.  After which, trop 0.   I discussed with pts cardiologist, Dr Sharyn Lull - he indicates prior stress test neg, and indicates pt can f/u with him in the office.   Patient remains asymptomatic, and appears stable for d/c.      Cathren Laine, MD 03/25/17 [phone]

## 2017-03-25 NOTE — ED Notes (Signed)
Pt and wife state they understand instructions. Home stable with steady gait. 

## 2017-03-25 NOTE — ED Triage Notes (Signed)
Pt from home via [facility]. Pt c/o CP w/ "severe nausea" related to his anxiety. Pt sts the pain gets worse when he feels anxious and gets better w/ rest. He has been treating @ home w/ NTG and sts he got some relief @ home. Pt has hx of MI. PTA pt was given 3 NTG 7 324 ASA by EMS. Denies any pain @ this time.

## 2017-03-25 NOTE — Discharge Instructions (Signed)
It was our pleasure to provide your ER care today - we hope that you feel better.  We discussed your case with Dr Sharyn Lull - he indicates for you to follow up with him in the office - call office to arrange appointment.   Return to ER if worse, new symptoms, trouble breathing, recurrent or persistent chest pain, other concern.

## 2017-03-25 NOTE — ED Provider Notes (Signed)
[facility]-EMERGENCY DEPT Provider Note   CSN: [id number] Arrival date & time: 03/25/17  4098     History   Chief Complaint No chief complaint on file.   HPI [name] is a 71 y.o. male.  HPI  This is a 71 year old male with a history of coronary artery disease and hypertension who presents with chest pain. Patient states that he had one episode of chest pressure yesterday. He took his blood pressure and noted it to be over 200. Chest pressure self resolved. Did not note that it was worse with ambulation. States that he woke up this morning and felt recurrent chest pressure. It was nonradiating. He took his blood pressure again and it was elevated. He was given nitroglycerin by EMS. He currently states that he is chest pain-free and feels better. He was given a full dose aspirin as well. His cardiologist is Dr. Sharyn Lull.  Last stress test was August 2017. It was low risk.  Patient denies any recent fevers, cough, illnesses, shortness of breath. He does report increased stressors at home.  Past Medical History:  Diagnosis Date  . Angina   . Coronary artery disease   . GERD (gastroesophageal reflux disease)   . Hypertension     Patient Active Problem List   Diagnosis Date Noted  . Acute coronary syndrome (HCC) 03/30/2012    Past Surgical History:  Procedure Laterality Date  . CORONARY ANGIOPLASTY WITH STENT PLACEMENT    . CORONARY ARTERY BYPASS GRAFT    . HERNIA REPAIR    . KNEE ARTHROSCOPY    . LEFT HEART CATHETERIZATION WITH CORONARY ANGIOGRAM N/A 03/31/2012   Procedure: LEFT HEART CATHETERIZATION WITH CORONARY ANGIOGRAM;  Surgeon: Robynn Pane, MD;  Location: [facility] CATH LAB;  Service: Cardiovascular;  Laterality: N/A;  . TONSILLECTOMY         Home Medications    Prior to Admission medications   Medication Sig Start Date End Date Taking? Authorizing Provider  acetaminophen (TYLENOL) 325 MG tablet Take 325 mg by mouth every 6 (six) hours as needed for headache.     Historical Provider, MD  aspirin EC 81 MG tablet Take 81 mg by mouth at bedtime.     Historical Provider, MD  clopidogrel (PLAVIX) 75 MG tablet Take 75 mg by mouth daily.    Historical Provider, MD  doxazosin (CARDURA) 1 MG tablet Take 1 mg by mouth daily.    Historical Provider, MD  EPINEPHrine (EPIPEN 2-PAK) 0.3 mg/0.3 mL IJ SOAJ injection Inject 0.3 mLs (0.3 mg total) into the muscle once as needed (for severe allergic reaction). CAll 911 immediately if you have to use this medicine 12/15/16   Antony Madura, PA-C  meclizine (ANTIVERT) 25 MG tablet Take 25 mg by mouth 3 (three) times daily as needed for dizziness.    Historical Provider, MD  metoprolol succinate (TOPROL-XL) 50 MG 24 hr tablet Take 1 tablet (50 mg total) by mouth daily. Patient takes at lunchtime Take with or immediately following a meal. 04/01/12   Rinaldo Cloud, MD  Multiple Vitamin (MULTIVITAMIN WITH MINERALS) TABS tablet Take 1 tablet by mouth daily.    Historical Provider, MD  nitroGLYCERIN (NITROSTAT) 0.4 MG SL tablet Place 0.4 mg under the tongue every 5 (five) minutes as needed. For chest pain    Historical Provider, MD  Omega-3 Fatty Acids (FISH OIL PO) Take 1 capsule by mouth 2 (two) times daily. Patient takes 1 in the morning, 1 at lunchtime, 2 with dinner  Historical Provider, MD  pravastatin (PRAVACHOL) 40 MG tablet Take 40 mg by mouth every evening.    Historical Provider, MD  ranitidine (ZANTAC) 150 MG tablet Take 150 mg by mouth 2 (two) times daily as needed for heartburn.    Historical Provider, MD  [facility] Wort ([employer] [facility] WORT) 150 MG CAPS Take 300 mg by mouth every morning.    Historical Provider, MD    Family History No family history on file.  Social History Social History  Substance Use Topics  . Smoking status: Never Smoker  . Smokeless tobacco: Never Used  . Alcohol use No     Allergies   Other   Review of Systems Review of Systems  Constitutional: Negative for diaphoresis and fever.    Respiratory: Negative for cough and shortness of breath.   Cardiovascular: Positive for chest pain. Negative for leg swelling.  Gastrointestinal: Negative for abdominal pain, nausea and vomiting.  All other systems reviewed and are negative.    Physical Exam Updated Vital Signs BP (!) 149/81 (BP Location: Left Arm)   Pulse 89   Temp 97.3 F (36.3 C) (Axillary)   Resp 18   SpO2 100%   Physical Exam  Constitutional: He is oriented to person, place, and time. No distress.  Elderly, no acute distress  HENT:  Head: Normocephalic and atraumatic.  Cardiovascular: Normal rate and normal heart sounds.   No murmur heard. Irregular rhythm  Pulmonary/Chest: Effort normal and breath sounds normal. No respiratory distress. He has no wheezes.  Abdominal: Soft. Bowel sounds are normal. There is no tenderness. There is no rebound.  Musculoskeletal: He exhibits no edema.  Neurological: He is alert and oriented to person, place, and time.  Skin: Skin is warm and dry.  Psychiatric: He has a normal mood and affect.  Nursing note and vitals reviewed.    ED Treatments / Results  Labs (all labs ordered are listed, but only abnormal results are displayed) Labs Reviewed  CBC WITH DIFFERENTIAL/PLATELET  BASIC METABOLIC PANEL  I-STAT TROPOININ, ED    EKG  EKG Interpretation  Date/Time:  Tuesday March 25 2017 06:33:15 EDT Ventricular Rate:  65 PR Interval:    QRS Duration: 93 QT Interval:  406 QTC Calculation: 423 R Axis:   68 Text Interpretation:  Sinus rhythm Atrial premature complex RSR' in V1 or V2, probably normal variant Confirmed by HORTON  MD, Toni Amend ([id number]) on 03/25/2017 6:41:36 AM       Radiology No results found.  Procedures Procedures (including critical care time)  Medications Ordered in ED Medications - No data to display   Initial Impression / Assessment and Plan / ED Course  I have reviewed the triage vital signs and the nursing notes.  Pertinent labs &  imaging results that were available during my care of the patient were reviewed by me and considered in my medical decision making (see chart for details).     Patient presents with chest pain. History of coronary artery disease. Also history of recurrent chest pain.  Patient is pain-free at this time.  Initial EKG shows no evidence of acute ischemia. Chest x-ray is reassuring. Lab work is pending at this time. Would favor touching base with Dr. Sharyn Lull after lab work results regarding patient disposition. He may be a candidate for delta troponin given that he is pain-free and has had a recent negative stress test. However, he does have known coronary artery disease and his cardiologist may want to proceed with further diagnostics.  Final Clinical Impressions(s) / ED Diagnoses   Final diagnoses:  Precordial chest pain    New Prescriptions New Prescriptions   No medications on file     Shon Baton, MD 03/25/17 [phone]

## 2018-08-31 ENCOUNTER — Emergency Department (HOSPITAL_COMMUNITY): Payer: BLUE CROSS/BLUE SHIELD

## 2018-08-31 ENCOUNTER — Encounter (HOSPITAL_COMMUNITY): Payer: Self-pay | Admitting: *Deleted

## 2018-08-31 ENCOUNTER — Other Ambulatory Visit: Payer: Self-pay

## 2018-08-31 ENCOUNTER — Observation Stay (HOSPITAL_COMMUNITY): Payer: BLUE CROSS/BLUE SHIELD

## 2018-08-31 ENCOUNTER — Observation Stay (HOSPITAL_COMMUNITY)
Admission: EM | Admit: 2018-08-31 | Discharge: 2018-09-01 | Disposition: A | Discharge status: Home / Self Care | Payer: BLUE CROSS/BLUE SHIELD | Attending: Emergency Medicine | Admitting: Emergency Medicine

## 2018-08-31 DIAGNOSIS — R531 Weakness: Secondary | ICD-10-CM

## 2018-08-31 DIAGNOSIS — Z7982 Long term (current) use of aspirin: Secondary | ICD-10-CM | POA: Diagnosis not present

## 2018-08-31 DIAGNOSIS — G459 Transient cerebral ischemic attack, unspecified: Secondary | ICD-10-CM | POA: Diagnosis not present

## 2018-08-31 DIAGNOSIS — K219 Gastro-esophageal reflux disease without esophagitis: Secondary | ICD-10-CM | POA: Diagnosis present

## 2018-08-31 DIAGNOSIS — I10 Essential (primary) hypertension: Secondary | ICD-10-CM | POA: Insufficient documentation

## 2018-08-31 DIAGNOSIS — Z79899 Other long term (current) drug therapy: Secondary | ICD-10-CM | POA: Diagnosis not present

## 2018-08-31 DIAGNOSIS — I2510 Atherosclerotic heart disease of native coronary artery without angina pectoris: Secondary | ICD-10-CM | POA: Diagnosis present

## 2018-08-31 DIAGNOSIS — R4701 Aphasia: Secondary | ICD-10-CM

## 2018-08-31 DIAGNOSIS — F419 Anxiety disorder, unspecified: Secondary | ICD-10-CM | POA: Diagnosis not present

## 2018-08-31 DIAGNOSIS — E785 Hyperlipidemia, unspecified: Secondary | ICD-10-CM | POA: Diagnosis present

## 2018-08-31 LAB — CBC
HCT: 46.3 % (ref 39.0–52.0)
Hemoglobin: 16 g/dL (ref 13.0–17.0)
MCH: 34.4 pg — ABNORMAL HIGH (ref 26.0–34.0)
MCHC: 34.6 g/dL (ref 30.0–36.0)
MCV: 99.6 fL (ref 78.0–100.0)
PLATELETS: 208 10*3/uL (ref 150–400)
RBC: 4.65 MIL/uL (ref 4.22–5.81)
RDW: 12 % (ref 11.5–15.5)
WBC: 6.5 10*3/uL (ref 4.0–10.5)

## 2018-08-31 LAB — COMPREHENSIVE METABOLIC PANEL
ALBUMIN: 4.2 g/dL (ref 3.5–5.0)
ALK PHOS: 57 U/L (ref 38–126)
ALT: 24 U/L (ref 0–44)
AST: 28 U/L (ref 15–41)
Anion gap: 11 (ref 5–15)
BILIRUBIN TOTAL: 1 mg/dL (ref 0.3–1.2)
BUN: 14 mg/dL (ref 8–23)
CALCIUM: 10 mg/dL (ref 8.9–10.3)
CHLORIDE: 103 mmol/L (ref 98–111)
CO2: 25 mmol/L (ref 22–32)
CREATININE: 1.09 mg/dL (ref 0.61–1.24)
GFR calc Af Amer: >60 mL/min (ref >60)
GFR calc non Af Amer: >60 mL/min (ref >60)
Glucose, Bld: 122 mg/dL — ABNORMAL HIGH (ref 70–99)
Potassium: 4.3 mmol/L (ref 3.5–5.1)
SODIUM: 139 mmol/L (ref 135–145)
Total Protein: 7.5 g/dL (ref 6.5–8.1)

## 2018-08-31 LAB — I-STAT TROPONIN, ED: TROPONIN I, POC: 0 ng/mL (ref 0.00–0.08)

## 2018-08-31 LAB — DIFFERENTIAL
Abs Immature Granulocytes: 0.1 10*3/uL (ref 0.0–0.1)
BASOS ABS: 0 10*3/uL (ref 0.0–0.1)
Basophils Relative: 1 %
EOS ABS: 0.1 10*3/uL (ref 0.0–0.7)
EOS PCT: 1 %
IMMATURE GRANULOCYTES: 1 %
Lymphocytes Relative: 26 %
Lymphs Abs: 1.7 10*3/uL (ref 0.7–4.0)
MONO ABS: 0.7 10*3/uL (ref 0.1–1.0)
Monocytes Relative: 11 %
Neutro Abs: 3.9 10*3/uL (ref 1.7–7.7)
Neutrophils Relative %: 60 %

## 2018-08-31 LAB — I-STAT CHEM 8, ED
BUN: 16 mg/dL (ref 8–23)
CALCIUM ION: 1.12 mmol/L — AB (ref 1.15–1.40)
CHLORIDE: 104 mmol/L (ref 98–111)
CREATININE: 1.1 mg/dL (ref 0.61–1.24)
GLUCOSE: 119 mg/dL — AB (ref 70–99)
HCT: 48 % (ref 39.0–52.0)
Hemoglobin: 16.3 g/dL (ref 13.0–17.0)
POTASSIUM: 4.3 mmol/L (ref 3.5–5.1)
Sodium: 139 mmol/L (ref 135–145)
TCO2: 26 mmol/L (ref 22–32)

## 2018-08-31 LAB — PROTIME-INR
INR: 0.95
PROTHROMBIN TIME: 12.5 s (ref 11.4–15.2)

## 2018-08-31 LAB — APTT: APTT: 31 s (ref 24–36)

## 2018-08-31 MED ORDER — ASPIRIN EC 81 MG PO TBEC
81.0000 mg | DELAYED_RELEASE_TABLET | Freq: Every day | ORAL | Status: DC
  Administered 2018-08-31: 81 mg via ORAL
  Filled 2018-08-31: qty 1

## 2018-08-31 MED ORDER — OMEGA-3-ACID ETHYL ESTERS 1 G PO CAPS
1.0000 g | ORAL_CAPSULE | Freq: Every day | ORAL | Status: DC
  Administered 2018-09-01: 1 g via ORAL
  Filled 2018-08-31: qty 1

## 2018-08-31 MED ORDER — LORAZEPAM 2 MG/ML IJ SOLN
0.5000 mg | Freq: Once | INTRAMUSCULAR | Status: AC
  Administered 2018-08-31: 0.5 mg via INTRAVENOUS
  Filled 2018-08-31: qty 1

## 2018-08-31 MED ORDER — OMEGA-3-ACID ETHYL ESTERS 1 G PO CAPS
2.0000 g | ORAL_CAPSULE | Freq: Every day | ORAL | Status: DC
  Administered 2018-08-31: 2 g via ORAL
  Filled 2018-08-31: qty 2

## 2018-08-31 MED ORDER — ZOLPIDEM TARTRATE 5 MG PO TABS: 5.0000 mg | ORAL_TABLET | Freq: Every evening | ORAL | Status: DC | PRN

## 2018-08-31 MED ORDER — ST JOHNS WORT 150 MG PO CAPS: 300.0000 mg | ORAL_CAPSULE | ORAL | Status: DC

## 2018-08-31 MED ORDER — ATORVASTATIN CALCIUM 80 MG PO TABS
80.0000 mg | ORAL_TABLET | Freq: Every day | ORAL | Status: DC
  Administered 2018-08-31: 80 mg via ORAL
  Filled 2018-08-31: qty 1

## 2018-08-31 MED ORDER — HYDRALAZINE HCL 20 MG/ML IJ SOLN: 5.0000 mg | INTRAMUSCULAR | Status: DC | PRN

## 2018-08-31 MED ORDER — OMEGA-3-ACID ETHYL ESTERS 1 G PO CAPS
1.0000 g | ORAL_CAPSULE | Freq: Every day | ORAL | Status: DC
  Administered 2018-09-01: 1 g via ORAL
  Filled 2018-08-31 (×2): qty 1

## 2018-08-31 MED ORDER — SENNOSIDES-DOCUSATE SODIUM 8.6-50 MG PO TABS: 1.0000 | ORAL_TABLET | Freq: Every evening | ORAL | Status: DC | PRN

## 2018-08-31 MED ORDER — ACETAMINOPHEN 650 MG RE SUPP: 650.0000 mg | RECTAL | Status: DC | PRN

## 2018-08-31 MED ORDER — NITROGLYCERIN 0.4 MG SL SUBL: 0.4000 mg | SUBLINGUAL_TABLET | SUBLINGUAL | Status: DC | PRN

## 2018-08-31 MED ORDER — ADULT MULTIVITAMIN W/MINERALS CH
1.0000 | ORAL_TABLET | Freq: Every day | ORAL | Status: DC
  Administered 2018-09-01: 1 via ORAL
  Filled 2018-08-31 (×2): qty 1

## 2018-08-31 MED ORDER — STROKE: EARLY STAGES OF RECOVERY BOOK
Freq: Once | Status: AC
  Administered 2018-08-31: 23:00:00
  Filled 2018-08-31: qty 1

## 2018-08-31 MED ORDER — FAMOTIDINE 20 MG PO TABS: 20.0000 mg | ORAL_TABLET | Freq: Two times a day (BID) | ORAL | Status: DC | PRN

## 2018-08-31 MED ORDER — ENOXAPARIN SODIUM 40 MG/0.4ML ~~LOC~~ SOLN
40.0000 mg | SUBCUTANEOUS | Status: DC
  Administered 2018-08-31: 40 mg via SUBCUTANEOUS
  Filled 2018-08-31: qty 0.4

## 2018-08-31 MED ORDER — ONDANSETRON HCL 4 MG/2ML IJ SOLN: 4.0000 mg | Freq: Three times a day (TID) | INTRAMUSCULAR | Status: DC | PRN

## 2018-08-31 MED ORDER — SERTRALINE HCL 50 MG PO TABS
25.0000 mg | ORAL_TABLET | Freq: Every day | ORAL | Status: DC
  Administered 2018-09-01: 25 mg via ORAL
  Filled 2018-08-31: qty 1

## 2018-08-31 MED ORDER — ALPRAZOLAM 0.25 MG PO TABS
0.2500 mg | ORAL_TABLET | Freq: Two times a day (BID) | ORAL | Status: DC | PRN
  Administered 2018-08-31: 0.25 mg via ORAL
  Filled 2018-08-31: qty 1

## 2018-08-31 MED ORDER — OMEGA-3-ACID ETHYL ESTERS 1 G PO CAPS: 1.0000 g | ORAL_CAPSULE | Freq: Every day | ORAL | Status: DC

## 2018-08-31 MED ORDER — DOXAZOSIN MESYLATE 1 MG PO TABS
1.0000 mg | ORAL_TABLET | Freq: Every day | ORAL | Status: DC
  Administered 2018-08-31: 1 mg via ORAL
  Filled 2018-08-31 (×2): qty 1

## 2018-08-31 MED ORDER — CLOPIDOGREL BISULFATE 75 MG PO TABS
75.0000 mg | ORAL_TABLET | Freq: Every day | ORAL | Status: DC
  Administered 2018-09-01: 75 mg via ORAL
  Filled 2018-08-31 (×2): qty 1

## 2018-08-31 MED ORDER — ACETAMINOPHEN 325 MG PO TABS: 650.0000 mg | ORAL_TABLET | ORAL | Status: DC | PRN

## 2018-08-31 MED ORDER — ACETAMINOPHEN 160 MG/5ML PO SOLN: 650.0000 mg | ORAL | Status: DC | PRN

## 2018-08-31 NOTE — Progress Notes (Addendum)
ED report received at 2135. Pt arrived to the unit via stretcher; ambulated to the BR with staff assistance; pt oriented to the unit and room; fall/safety precaution and prevention education completed with pt and spouse at bedside; both voices understanding and denies any questions. VSS: telemetry applied and verified  with CCMD and NT called to second verify. Skin intact with no opened wounds except for slight abrasion to LLE. Pt in bed with call light within reach and spouse at bedside. Reported off to oncoming RN.  Dionne BucyP. Amo Calvary Difranco RN

## 2018-08-31 NOTE — ED Notes (Addendum)
Patient transported to MRI. Then will be brought to room

## 2018-08-31 NOTE — ED Notes (Signed)
Patient transported to MRI 

## 2018-08-31 NOTE — Progress Notes (Signed)
PHARMACIST - PHYSICIAN ORDER COMMUNICATION  CONCERNING: P&T Medication Policy on Herbal Medications  DESCRIPTION:  This patient's order for:  [employer] John's Wort  has been noted.  This product(s) is classified as an "herbal" or natural product. Due to a lack of definitive safety studies or FDA approval, nonstandard manufacturing practices, plus the potential risk of unknown drug-drug interactions while on inpatient medications, the Pharmacy and Therapeutics Committee does not permit the use of "herbal" or natural products of this type within [facility].   ACTION TAKEN: The pharmacy department is unable to verify this order at this time and your patient has been informed of this safety policy. Please reevaluate patient's clinical condition at discharge and address if the herbal or natural product(s) should be resumed at that time.   Noah Delaineuth Georgana Romain, RPh Clinical Pharmacist 08/31/2018 10:23 PM

## 2018-08-31 NOTE — ED Provider Notes (Signed)
+++  MSE was initiated and I personally evaluated the patient and placed orders (if any) at  1:12 PM on August 31, 2018.  The patient appears stable so that the remainder of the MSE may be completed by another provider.  Patient placed in Quick Look pathway, seen and evaluated   Chief Complaint: Slurred speech, left arm weakness, now resolved  HPI:   Patient is a 72 yo male with a history of CAD s/p CABG on DAPT presenting for resolved slurred speech and left arm weakness at 3am . Called PCP this AM who stated he should be evaluated. Sx lasted approximately 3 hours and resolved between 6-8 am. Patient denies any symptoms now or CP or SOB. No palpitations. No hx Afib or CVA. Pt reporting URI symptoms with nasal congestions.   ROS:  (one) See HPI.  Physical Exam:   Gen: No distress  Neuro: Awake and Alert  Skin: Warm    Focused Exam:  Mental Status:  Alert, oriented, thought content appropriate, able to give a coherent history. Speech fluent without evidence of aphasia. Able to follow 2 step commands without difficulty.  Cranial Nerves:  II:  Peripheral visual fields grossly normal, pupils equal, round, reactive to light III,IV, VI: ptosis not present, extra-ocular motions intact bilaterally  V,VII: smile symmetric, facial light touch sensation equal VIII: hearing grossly normal to voice  X: uvula elevates symmetrically  XI: bilateral shoulder shrug symmetric and strong XII: midline tongue extension without fassiculations Motor:  Normal tone. 5/5 in upper and lower extremities bilaterally including strong and equal grip strength and dorsiflexion/plantar flexion Stance: No pronator drift and good coordination, strength, and position sense with tapping of bilateral arms (performed in sitting position).    Large Artery Stroke Screening     Weakness:  Patient shows no weakness.    Vision:  NONE  Aphasia:  NONE  Neglect:  NONE:  VAN =  Negative  If patient has any weakness  PLUS any one of the below: Visual Disturbance (field cut, double, or blind vision) Aphasia (inability to speak or understand) Neglect (gaze to one side or ignoring one side) This is likely a large artery clot (cortical symptoms) = VAN Positive  Clinically concerned for TIA. NO SIGNS OF STROKE IN TRIAGE. Discussed etiology with patient and initiated stroke order set.    Initiation of care has begun. The patient has been counseled on the process, plan, and necessity for staying for the completion/evaluation, and the remainder of the medical screening examination    Delia ChimesMurray, Apple Dearmas B, PA-C 08/31/18 1330    Benjiman CorePickering, Nathan, MD 08/31/18 1551

## 2018-08-31 NOTE — ED Provider Notes (Signed)
[facility] EMERGENCY DEPARTMENT Provider Note   CSN: [id number] Arrival date & time: 08/31/18  1303     History   Chief Complaint Chief Complaint  Patient presents with  . Weakness    HPI [name] is a 72 y.o. male.  HPI  72 year old man history of hypertension, coronary artery disease presents today stating that he awoke at 3 AM from a bad dream and had left-sided weakness.  Last known normal was 10 PM last night.  He had left facial droop, garbled speech, difficulty walking, and left-sided weakness that lasted from 3 AM until sometime in the mid morning.  He and his wife noted the deficits and went back to sleep.  She woke around 8 AM and called his primary care physician.  He was told he could come in around noon today went back to sleep.  When they woke up to get ready for his doctor's appointment his symptoms had improved.  He was told to come to the ED and had MRI ordered on initial evaluation by provider in triage.  Last known normal at that point was out of window for acute stroke.  Patient states he had a URI last week.  He currently denies any chest pain, dyspnea, or other pain.  He lives at home with his wife who is also present.  He also notes resolution of the symptoms.  Past Medical History:  Diagnosis Date  . Angina   . Coronary artery disease   . GERD (gastroesophageal reflux disease)   . Hypertension     Patient Active Problem List   Diagnosis Date Noted  . Acute coronary syndrome (HCC) 03/30/2012    Past Surgical History:  Procedure Laterality Date  . CORONARY ANGIOPLASTY WITH STENT PLACEMENT    . CORONARY ARTERY BYPASS GRAFT    . HERNIA REPAIR    . KNEE ARTHROSCOPY    . LEFT HEART CATHETERIZATION WITH CORONARY ANGIOGRAM N/A 03/31/2012   Procedure: LEFT HEART CATHETERIZATION WITH CORONARY ANGIOGRAM;  Surgeon: Robynn Pane, MD;  Location: [facility] CATH LAB;  Service: Cardiovascular;  Laterality: N/A;  . TONSILLECTOMY           Home Medications    Prior to Admission medications   Medication Sig Start Date End Date Taking? Authorizing Provider  acetaminophen (TYLENOL) 325 MG tablet Take 325 mg by mouth every 6 (six) hours as needed for headache.    [provider]  aspirin EC 81 MG tablet Take 81 mg by mouth at bedtime.     [provider]  clopidogrel (PLAVIX) 75 MG tablet Take 75 mg by mouth daily.    [provider]  doxazosin (CARDURA) 1 MG tablet Take 1 mg by mouth daily.    [provider]  EPINEPHrine (EPIPEN 2-PAK) 0.3 mg/0.3 mL IJ SOAJ injection Inject 0.3 mLs (0.3 mg total) into the muscle once as needed (for severe allergic reaction). CAll 911 immediately if you have to use this medicine 12/15/16   Antony Madura, PA-C  meclizine (ANTIVERT) 25 MG tablet Take 25 mg by mouth 3 (three) times daily as needed for dizziness.    [provider]  metoprolol succinate (TOPROL-XL) 50 MG 24 hr tablet Take 1 tablet (50 mg total) by mouth daily. Patient takes at lunchtime Take with or immediately following a meal. 04/01/12   Rinaldo Cloud, MD  Multiple Vitamin (MULTIVITAMIN WITH MINERALS) TABS tablet Take 1 tablet by mouth daily.    [provider]  nitroGLYCERIN (  NITROSTAT) 0.4 MG SL tablet Place 0.4 mg under the tongue every 5 (five) minutes as needed. For chest pain    [provider]  Omega-3 Fatty Acids (FISH OIL PO) Take 1 capsule by mouth 2 (two) times daily. Patient takes 1 in the morning, 1 at lunchtime, 2 with dinner     [provider]  pravastatin (PRAVACHOL) 40 MG tablet Take 40 mg by mouth every evening.    [provider]  ranitidine (ZANTAC) 150 MG tablet Take 150 mg by mouth 2 (two) times daily as needed for heartburn.    [provider]  [facility] Wort ([employer] [facility] JOHNS WORT) 150 MG CAPS Take 300 mg by mouth every morning.    [provider]    Family History History reviewed. No pertinent family  history.  Social History Social History   Tobacco Use  . Smoking status: Never Smoker  . Smokeless tobacco: Never Used  Substance Use Topics  . Alcohol use: No  . Drug use: No     Allergies   Other   Review of Systems Review of Systems  All other systems reviewed and are negative.    Physical Exam Updated Vital Signs BP (!) 149/85   Pulse (!) 55   Temp 98.9 F (37.2 C) (Oral)   Resp 18   SpO2 98%   Physical Exam  Constitutional: He is oriented to person, place, and time. He appears well-developed and well-nourished.  HENT:  Head: Normocephalic and atraumatic.  Right Ear: External ear normal.  Left Ear: External ear normal.  Nose: Nose normal.  Mouth/Throat: Oropharynx is clear and moist.  Eyes: Pupils are equal, round, and reactive to light.  Neck: Normal range of motion. Neck supple.  Cardiovascular: Normal rate, regular rhythm and normal heart sounds.  Pulmonary/Chest: Effort normal.  Abdominal: Soft. Bowel sounds are normal.  Musculoskeletal: Normal range of motion.  Neurological: He is alert and oriented to person, place, and time. He displays normal reflexes. No cranial nerve deficit or sensory deficit. He exhibits normal muscle tone. Coordination normal.  Skin: Skin is warm. Capillary refill takes less than 2 seconds.  Psychiatric: He has a normal mood and affect.  Nursing note and vitals reviewed.    ED Treatments / Results  Labs (all labs ordered are listed, but only abnormal results are displayed) Labs Reviewed  CBC - Abnormal; Notable for the following components:      Result Value   MCH 34.4 (*)    All other components within normal limits  COMPREHENSIVE METABOLIC PANEL - Abnormal; Notable for the following components:   Glucose, Bld 122 (*)    All other components within normal limits  I-STAT CHEM 8, ED - Abnormal; Notable for the following components:   Glucose, Bld 119 (*)    Calcium, Ion 1.12 (*)    All other components within normal  limits  PROTIME-INR  APTT  DIFFERENTIAL  URINALYSIS, ROUTINE W REFLEX MICROSCOPIC  I-STAT TROPONIN, ED  CBG MONITORING, ED  CBG MONITORING, ED    EKG EKG Interpretation  Date/Time:  Monday August 31 2018 13:07:52 EDT Ventricular Rate:  66 PR Interval:  156 QRS Duration: 80 QT Interval:  384 QTC Calculation: 402 R Axis:   52 Text Interpretation:  Normal sinus rhythm Non-specific ST-t changes No significant change since last tracing 25 March 2017 Confirmed by Margarita Grizzleay, Zaeem Kandel [phone]([id number]) on 08/31/2018 7:25:21 PM   Radiology Ct Head Wo Contrast  Result Date: 08/31/2018 CLINICAL DATA:  Left-sided  facial droop and slurred speech for several hours EXAM: CT HEAD WITHOUT CONTRAST TECHNIQUE: Contiguous axial images were obtained from the base of the skull through the vertex without intravenous contrast. COMPARISON:  None. FINDINGS: Brain: No evidence of acute infarction, hemorrhage, hydrocephalus, extra-axial collection or mass lesion/mass effect. Vascular: No hyperdense vessel or unexpected calcification. Skull: Normal. Negative for fracture or focal lesion. Sinuses/Orbits: No acute finding. Other: None. IMPRESSION: Normal head CT Electronically Signed   By: Alcide Clever M.D.   On: 08/31/2018 13:51   Mr Brain Wo Contrast  Result Date: 08/31/2018 CLINICAL DATA:  72 y/o M; TIA, initial exam. Episode of slurred speech and left arm numbness lasting approximately 2-3 hours. EXAM: MRI HEAD WITHOUT CONTRAST TECHNIQUE: Multiplanar, multiecho pulse sequences of the brain and surrounding structures were obtained without intravenous contrast. COMPARISON:  08/31/2018 CT head.  10/03/2012 MRI head. FINDINGS: Brain: No acute infarction, hemorrhage, hydrocephalus, extra-axial collection or mass lesion. Few stable nonspecific T2 FLAIR hyperintensities in subcortical and periventricular white matter are compatible with mild chronic microvascular ischemic changes for age. Mild stable volume loss of the brain.  Vascular: Normal flow voids. Skull and upper cervical spine: Normal marrow signal. Sinuses/Orbits: Negative. Other: None. IMPRESSION: 1. No acute intracranial abnormality. 2. Stable mild chronic microvascular ischemic changes and volume loss of the head. Electronically Signed   By: Mitzi Hansen M.D.   On: 08/31/2018 18:18    Procedures Procedures (including critical care time)  Medications Ordered in ED Medications - No data to display   Initial Impression / Assessment and Plan / ED Course  I have reviewed the triage vital signs and the nursing notes.  Pertinent labs & imaging results that were available during my care of the patient were reviewed by me and considered in my medical decision making (see chart for details).     72 year old male presents with several hour long episode of left-sided weakness earlier in the day.  At presentation last known normal had been 10 PM last night.  MRI obtained shows no evidence of acute stroke.  ABCD 2 score for TIA is 6. Labs and radiology studies reviewed Vitals:   08/31/18 1306 08/31/18 1502  BP: (!) 186/80 (!) 149/85  Pulse: (!) 57 (!) 55  Resp: 20 18  Temp: 98.9 F (37.2 C)   SpO2: 98% 98%   Discussed with Dr. Clyde Lundborg and he will see patient for further evaluation.  Final Clinical Impressions(s) / ED Diagnoses   Final diagnoses:  Aphasia  Weakness    ED Discharge Orders    None       Margarita Grizzle, MD 08/31/18 1945

## 2018-08-31 NOTE — H&P (Signed)
History and Physical    [name] NFA:[id number] DOB: 06/13/1946 DOA: 08/31/2018  Referring MD/NP/PA:   PCP: Kaleen MaskElkins, Tony Oliver, MD   Patient coming from:  The patient is coming from home.  At baseline, pt is independent for most of ADL.  Chief Complaint: slurred speech and left sided weakness  HPI: [name] is a [age] y.o. male with medical history significant of CAD, stent placement, CABG, GERD, hypertension, hyperlipidemia, who presents with slurred speech, left sided weakness.  Patient was last known normal at about10 PM last night. He states that he awoke at 3 AM from a bad dream, and noted left sided weakness and slurred speech at about 3:00 am. He states that he had difficulty walking. He went back to sleep.  She woke around 8 AM and called his primary care physician.  He was told he could come in around noon. When they woke up to get ready for his doctor's appointment his symptoms had improved, His symptoms lasted from 3 AM until sometime in the mid morning, around 10:00 AM. He was told to come to the ED for further evaluation and treatment. Patient states he had some symptoms of URI last week. Currently his symptoms have resolved.  At this time, patient does not have slurred speech, facial droop, unilateral weakness or numbness in extremities.  No vision change or hearing loss.  Patient denies chest pain, shortness breath, cough, fever or chills.  He has mild nausea, no vomiting, diarrhea or abdominal pain denies symptoms of UTI.  ED Course: pt was found to have WBC 6.5, INR 0.95, PT 31, negative troponin, pending urinalysis, electrolytes renal function okay, temperature normal, bradycardia, no tachypnea, oxygen saturation 98% on room air, negative CT head for acute intracranial abnormalities.  MRI for brain is negative for stroke, but showed stable mild chronic microvascular ischemic changes and volume loss of the head. Pt is placed on telemetry bed for  observation.  Review of Systems:   General: no fevers, chills, no body weight gain, has fatigue HEENT: no blurry vision, hearing changes or sore throat Respiratory: no dyspnea, coughing, wheezing CV: no chest pain, no palpitations GI: has nausea, no vomiting, abdominal pain, diarrhea, constipation GU: no dysuria, burning on urination, increased urinary frequency, hematuria  Ext: no leg edema Neuro: has slurred speech and left sided weakness Skin: no rash, no skin tear. MSK: No muscle spasm, no deformity, no limitation of range of movement in spin Heme: No easy bruising.  Travel history: No recent long distant travel.  Allergy:  Allergies  Allergen Reactions  . Other Other (See Comments)    Febreze(lavender) causes itching     Past Medical History:  Diagnosis Date  . Angina   . Coronary artery disease   . GERD (gastroesophageal reflux disease)   . Hypertension     Past Surgical History:  Procedure Laterality Date  . CORONARY ANGIOPLASTY WITH STENT PLACEMENT    . CORONARY ARTERY BYPASS GRAFT    . HERNIA REPAIR    . KNEE ARTHROSCOPY    . LEFT HEART CATHETERIZATION WITH CORONARY ANGIOGRAM N/A 03/31/2012   Procedure: LEFT HEART CATHETERIZATION WITH CORONARY ANGIOGRAM;  Surgeon: Robynn PaneMohan N Harwani, MD;  Location: [facility] CATH [facility];  Service: Cardiovascular;  Laterality: N/A;  . TONSILLECTOMY      Social History:  reports that he has never smoked. He has never used smokeless tobacco. He reports that he does not drink alcohol or use drugs.  Family History:  Family History  Problem Relation Age of Onset  . Alzheimer's disease Mother   . Liver disease Brother      Prior to Admission medications   Medication Sig Start Date End Date Taking? Authorizing Provider  acetaminophen (TYLENOL) 325 MG tablet Take 325 mg by mouth every 6 (six) hours as needed for headache.    [provider]  aspirin EC 81 MG tablet Take 81 mg by mouth at bedtime.     [provider]   clopidogrel (PLAVIX) 75 MG tablet Take 75 mg by mouth daily.    [provider]  doxazosin (CARDURA) 1 MG tablet Take 1 mg by mouth daily.    [provider]  EPINEPHrine (EPIPEN 2-PAK) 0.3 mg/0.3 mL IJ SOAJ injection Inject 0.3 mLs (0.3 mg total) into the muscle once as needed (for severe allergic reaction). CAll 911 immediately if you have to use this medicine 12/15/16   Antony Madura, PA-C  meclizine (ANTIVERT) 25 MG tablet Take 25 mg by mouth 3 (three) times daily as needed for dizziness.    [provider]  metoprolol succinate (TOPROL-XL) 50 MG 24 hr tablet Take 1 tablet (50 mg total) by mouth daily. Patient takes at lunchtime Take with or immediately following a meal. 04/01/12   Rinaldo Cloud, MD  Multiple Vitamin (MULTIVITAMIN WITH MINERALS) TABS tablet Take 1 tablet by mouth daily.    [provider]  nitroGLYCERIN (NITROSTAT) 0.4 MG SL tablet Place 0.4 mg under the tongue every 5 (five) minutes as needed. For chest pain    [provider]  Omega-3 Fatty Acids (FISH OIL PO) Take 1 capsule by mouth 2 (two) times daily. Patient takes 1 in the morning, 1 at lunchtime, 2 with dinner     [provider]  pravastatin (PRAVACHOL) 40 MG tablet Take 40 mg by mouth every evening.    [provider]  ranitidine (ZANTAC) 150 MG tablet Take 150 mg by mouth 2 (two) times daily as needed for heartburn.    [provider]  [facility] Wort ([facility] WORT) 150 MG CAPS Take 300 mg by mouth every morning.    [provider]    Physical Exam: Vitals:   08/31/18 1502 08/31/18 1915 08/31/18 1948 08/31/18 2000  BP: (!) 149/85 (!) 156/89  (!) 174/85  Pulse: (!) 55 (!) 56  (!) 55  Resp: 18     Temp:   98.8 F (37.1 C)   TempSrc:      SpO2: 98% 97%  96%   General: Not in acute distress. Pt is mildly anxious. HEENT:       Eyes: PERRL, EOMI, no scleral icterus.       ENT: No discharge from the ears and nose, no pharynx  injection, no tonsillar enlargement.        Neck: No JVD, no bruit, no mass felt. Heme: No neck lymph node enlargement. Cardiac: S1/S2, RRR, No murmurs, No gallops or rubs. Respiratory: No rales, wheezing, rhonchi or rubs. GI: Soft, nondistended, nontender, no rebound pain, no organomegaly, BS present. GU: No hematuria Ext: No pitting leg edema bilaterally. 2+DP/PT pulse bilaterally. Musculoskeletal: No joint deformities, No joint redness or warmth, no limitation of ROM in spin. Skin: No rashes.  Neuro: Alert, oriented X3, cranial nerves II-XII grossly intact, moves all extremities normally. Muscle strength 5/5 in all extremities, sensation to light touch intact. Brachial reflex 2+ bilaterally. Negative Babinski's sign. Normal finger to nose test. Psych: Patient is not psychotic, no suicidal or  hemocidal ideation.  Labs on Admission: I have personally reviewed following labs and imaging studies  CBC: Recent Labs  Lab 08/31/18 1324 08/31/18 1336  WBC 6.5  --   NEUTROABS 3.9  --   HGB 16.0 16.3  HCT 46.3 48.0  MCV 99.6  --   PLT 208  --    Basic Metabolic Panel: Recent Labs  Lab 08/31/18 1324 08/31/18 1336  NA 139 139  K 4.3 4.3  CL 103 104  CO2 25  --   GLUCOSE 122* 119*  BUN 14 16  CREATININE 1.09 1.10  CALCIUM 10.0  --    GFR: CrCl cannot be calculated (Unknown ideal weight.). Liver Function Tests: Recent Labs  Lab 08/31/18 1324  AST 28  ALT 24  ALKPHOS 57  BILITOT 1.0  PROT 7.5  ALBUMIN 4.2   No results for input(s): LIPASE, AMYLASE in the last 168 hours. No results for input(s): AMMONIA in the last 168 hours. Coagulation Profile: Recent Labs  Lab 08/31/18 1324  INR 0.95   Cardiac Enzymes: No results for input(s): CKTOTAL, CKMB, CKMBINDEX, TROPONINI in the last 168 hours. BNP (last 3 results) No results for input(s): PROBNP in the last 8760 hours. HbA1C: No results for input(s): HGBA1C in the last 72 hours. CBG: No results for input(s): GLUCAP in  the last 168 hours. Lipid Profile: No results for input(s): CHOL, HDL, LDLCALC, TRIG, CHOLHDL, LDLDIRECT in the last 72 hours. Thyroid Function Tests: No results for input(s): TSH, T4TOTAL, FREET4, T3FREE, THYROIDAB in the last 72 hours. Anemia Panel: No results for input(s): VITAMINB12, FOLATE, FERRITIN, TIBC, IRON, RETICCTPCT in the last 72 hours. Urine analysis: No results found for: COLORURINE, APPEARANCEUR, LABSPEC, PHURINE, GLUCOSEU, HGBUR, BILIRUBINUR, KETONESUR, PROTEINUR, UROBILINOGEN, NITRITE, LEUKOCYTESUR Sepsis Labs: @LABRCNTIP (procalcitonin:4,lacticidven:4) )No results found for this or any previous visit (from the past 240 hour(s)).   Radiological Exams on Admission: Ct Head Wo Contrast  Result Date: 08/31/2018 CLINICAL DATA:  Left-sided facial droop and slurred speech for several hours EXAM: CT HEAD WITHOUT CONTRAST TECHNIQUE: Contiguous axial images were obtained from the base of the skull through the vertex without intravenous contrast. COMPARISON:  None. FINDINGS: Brain: No evidence of acute infarction, hemorrhage, hydrocephalus, extra-axial collection or mass lesion/mass effect. Vascular: No hyperdense vessel or unexpected calcification. Skull: Normal. Negative for fracture or focal lesion. Sinuses/Orbits: No acute finding. Other: None. IMPRESSION: Normal head CT Electronically Signed   By: Alcide Clever M.D.   On: 08/31/2018 13:51   Mr Brain Wo Contrast  Result Date: 08/31/2018 CLINICAL DATA:  71 y/o M; TIA, initial exam. Episode of slurred speech and left arm numbness lasting approximately 2-3 hours. EXAM: MRI HEAD WITHOUT CONTRAST TECHNIQUE: Multiplanar, multiecho pulse sequences of the brain and surrounding structures were obtained without intravenous contrast. COMPARISON:  08/31/2018 CT head.  10/03/2012 MRI head. FINDINGS: Brain: No acute infarction, hemorrhage, hydrocephalus, extra-axial collection or mass lesion. Few stable nonspecific T2 FLAIR hyperintensities in  subcortical and periventricular white matter are compatible with mild chronic microvascular ischemic changes for age. Mild stable volume loss of the brain. Vascular: Normal flow voids. Skull and upper cervical spine: Normal marrow signal. Sinuses/Orbits: Negative. Other: None. IMPRESSION: 1. No acute intracranial abnormality. 2. Stable mild chronic microvascular ischemic changes and volume loss of the head. Electronically Signed   By: Mitzi Hansen M.D.   On: 08/31/2018 18:18     EKG: Independently reviewed.  Sinus rhythm, QTC 402, early R wave progression, nonspecific T wave change, mild ST depression.  Assessment/Plan Principal Problem:   TIA (transient ischemic attack) Active Problems:   CAD (coronary artery disease)   HLD (hyperlipidemia)   GERD (gastroesophageal reflux disease)   TIA (transient ischemic attack): Patient symptoms are concerning for TIA.  CT head is negative for acute intracranial abnormalities.  MRI of the brain did not show acute stroke.  His symptoms have resolved currently.  - will place to tele bed -Atrial fibrillation: not present  -tPA was not given because symptoms are out of window for tPA - Risk factor modification: HgbA1c, fasting lipid panel - MRA of the brain without contrast  - PT consult, OT consult - Bedside swallowing screen was ordered - 2 d Echocardiogram  - Carotid dopplers  - pt is on Aspirin and plvix - switched pravastatin to Lipitor 80 mg daily - check UDS  CAD (coronary artery disease): s/p of stent and CABG. No CP. -Continue aspirin, Plavix - Lipitor -prn NTG  HLD (hyperlipidemia): -Lipitor  GERD (gastroesophageal reflux disease) -pepcid  Anxiety: -prn Xanax     DVT ppx:   SQ Lovenox Code Status: Full code Family Communication:  Yes, patient's wife at bed side Disposition Plan:  Anticipate discharge back to previous home environment Consults called:  none Admission status: Obs / tele    Date of Service  08/31/2018    Lorretta Harp [facility] Pager [phone]  If 7PM-7AM, please contact night-coverage [email or web address] Password [email or web address] 08/31/2018, 8:16 PM

## 2018-08-31 NOTE — ED Notes (Signed)
Admitting Provider at bedside. 

## 2018-08-31 NOTE — ED Triage Notes (Signed)
Pt in c/o episode of slurred speech, left arm weakness first noted when he woke up this morning around 3am, lasted approx 2-3 hours and mostly resolved, reports still some thick speech at times, no deficits at this time

## 2018-09-01 ENCOUNTER — Ambulatory Visit (HOSPITAL_BASED_OUTPATIENT_CLINIC_OR_DEPARTMENT_OTHER): Payer: BLUE CROSS/BLUE SHIELD

## 2018-09-01 ENCOUNTER — Observation Stay (HOSPITAL_BASED_OUTPATIENT_CLINIC_OR_DEPARTMENT_OTHER): Payer: BLUE CROSS/BLUE SHIELD

## 2018-09-01 DIAGNOSIS — I361 Nonrheumatic tricuspid (valve) insufficiency: Secondary | ICD-10-CM

## 2018-09-01 DIAGNOSIS — E785 Hyperlipidemia, unspecified: Secondary | ICD-10-CM | POA: Diagnosis not present

## 2018-09-01 DIAGNOSIS — G459 Transient cerebral ischemic attack, unspecified: Secondary | ICD-10-CM

## 2018-09-01 DIAGNOSIS — K219 Gastro-esophageal reflux disease without esophagitis: Secondary | ICD-10-CM | POA: Diagnosis not present

## 2018-09-01 LAB — LIPID PANEL
CHOL/HDL RATIO: 4.3 ratio
Cholesterol: 181 mg/dL (ref 0–200)
HDL: 42 mg/dL (ref >40)
LDL CALC: 96 mg/dL (ref 0–99)
TRIGLYCERIDES: 213 mg/dL — AB (ref <150)
VLDL: 43 mg/dL — ABNORMAL HIGH (ref 0–40)

## 2018-09-01 LAB — ECHOCARDIOGRAM COMPLETE

## 2018-09-01 MED ORDER — ATORVASTATIN CALCIUM 80 MG PO TABS: 80.0000 mg | ORAL_TABLET | Freq: Every day | ORAL | 0 refills | Status: AC

## 2018-09-01 NOTE — Consult Note (Addendum)
Neurology Consultation  Reason for Consult: Transient expressive aphasia and left-sided arm weakness Referring Physician: Allena KatzPatel   History is obtained from: Patient  HPI: Tony LintsCharles D Wheeler is a 72 y.o. male with history of hypertension, GERD, coronary artery disease with CABG, angina.  Patient states that he woke up at 3 AM and noted he had expressive aphasia when he was talking to his wife he also noted that he had difficulty moving his left arm.  "I was dragging my arm around and could not move it".  Patient usually wakes up at that time because he goes to work at 5:00.  They had called work and told them he was not going to be coming in that day.  He states that he went back to sleep at approximate 4 and woke up at 4:30 AM noting that his speech had cleared but the arm was still limp.  Later that morning they called his PCP who stated that he needed to go straight to the ED.  The arm weakness lasted for approximately 3 hours and then fully resolved.  Patient is currently already on aspirin, Plavix and Pravachol.  Patient states he has not missed any aspirin.  At present time patient is back to his baseline.  He does state he is a very significant worrier after he had had his heart issues and family members have had strokes.   LKW: 2200 hrs. on 08/31/2018 tpa given?: no, symptoms resolved Premorbid modified Rankin scale (mRS): 0 NIH stroke scale score: 0   ROS: A 14 point ROS was performed and is negative except as noted in the HPI.    Past Medical History:  Diagnosis Date  . Angina   . Coronary artery disease   . GERD (gastroesophageal reflux disease)   . Hypertension     Family History  Problem Relation Age of Onset  . Alzheimer's disease Mother   . Liver disease Brother      Social History:   reports that he has never smoked. He has never used smokeless tobacco. He reports that he does not drink alcohol or use drugs.  Medications  Current Facility-Administered Medications:   .  acetaminophen (TYLENOL) tablet 650 mg, 650 mg, Oral, Q4H PRN **OR** acetaminophen (TYLENOL) solution 650 mg, 650 mg, Per Tube, Q4H PRN **OR** acetaminophen (TYLENOL) suppository 650 mg, 650 mg, Rectal, Q4H PRN, Lorretta HarpNiu, Xilin, MD .  ALPRAZolam Prudy Feeler(XANAX) tablet 0.25 mg, 0.25 mg, Oral, BID PRN, Lorretta HarpNiu, Xilin, MD, 0.25 mg at 08/31/18 2239 .  aspirin EC tablet 81 mg, 81 mg, Oral, QHS, Lorretta HarpNiu, Xilin, MD, 81 mg at 08/31/18 2239 .  atorvastatin (LIPITOR) tablet 80 mg, 80 mg, Oral, q1800, Lorretta HarpNiu, Xilin, MD, 80 mg at 08/31/18 2240 .  clopidogrel (PLAVIX) tablet 75 mg, 75 mg, Oral, Daily, Lorretta HarpNiu, Xilin, MD .  doxazosin (CARDURA) tablet 1 mg, 1 mg, Oral, QHS, Lorretta HarpNiu, Xilin, MD, 1 mg at 08/31/18 2332 .  enoxaparin (LOVENOX) injection 40 mg, 40 mg, Subcutaneous, Q24H, Lorretta HarpNiu, Xilin, MD, 40 mg at 08/31/18 2239 .  famotidine (PEPCID) tablet 20 mg, 20 mg, Oral, BID PRN, Lorretta HarpNiu, Xilin, MD .  hydrALAZINE (APRESOLINE) injection 5 mg, 5 mg, Intravenous, Q2H PRN, Lorretta HarpNiu, Xilin, MD .  multivitamin with minerals tablet 1 tablet, 1 tablet, Oral, Daily, Lorretta HarpNiu, Xilin, MD .  nitroGLYCERIN (NITROSTAT) SL tablet 0.4 mg, 0.4 mg, Sublingual, Q5 min PRN, Lorretta HarpNiu, Xilin, MD .  omega-3 acid ethyl esters (LOVAZA) capsule 1 g, 1 g, Oral, Daily **AND** omega-3 acid ethyl esters (LOVAZA)  capsule 1 g, 1 g, Oral, Q lunch **AND** omega-3 acid ethyl esters (LOVAZA) capsule 2 g, 2 g, Oral, Q supper, Lorretta Harp, MD, 2 g at 08/31/18 2240 .  ondansetron (ZOFRAN) injection 4 mg, 4 mg, Intravenous, Q8H PRN, Lorretta Harp, MD .  senna-docusate (Senokot-S) tablet 1 tablet, 1 tablet, Oral, QHS PRN, Lorretta Harp, MD .  sertraline (ZOLOFT) tablet 25 mg, 25 mg, Oral, Daily, Lorretta Harp, MD .  zolpidem (AMBIEN) tablet 5 mg, 5 mg, Oral, QHS PRN, Lorretta Harp, MD   Exam: Current vital signs: BP (!) 146/75   Pulse (!) 53   Temp 98.2 F (36.8 C) (Oral)   Resp 14   SpO2 97%  Vital signs in last 24 hours: Temp:  [98.2 F (36.8 C)-98.9 F (37.2 C)] 98.2 F (36.8 C) (09/17 0426) Pulse  Rate:  [50-57] 53 (09/16 2223) Resp:  [14-20] 14 (09/16 2223) BP: (146-186)/(75-99) 146/75 (09/16 2332) SpO2:  [96 %-98 %] 97 % (09/16 2223)  GENERAL: Awake, alert in NAD HEENT: - Normocephalic and atraumatic, dry mm, Ext: warm, well perfused, intact peripheral pulses,  NEURO:  Mental Status: AA&Ox3, speech is clear.  Naming, repetition, fluency, and comprehension intact. Cranial Nerves: PERRL mm/brisk. EOMI, visual fields full, no facial asymmetry, facial sensation intact, hearing intact, tongue/uvula/soft palate 2 midline, normal sternocleidomastoid and trapezius muscle strength.  Motor: 5/5 throughout--patient does have a hard time relaxing however this is due to his nervousness Tone: is normal and bulk is normal Sensation- Intact to light touch bilaterally Coordination: FTN intact bilaterally, no ataxia in BLE.    Labs I have reviewed labs in epic and the results pertinent to this consultation are:   CBC    Component Value Date/Time   WBC 6.5 08/31/2018 1324   RBC 4.65 08/31/2018 1324   HGB 16.3 08/31/2018 1336   HCT 48.0 08/31/2018 1336   PLT 208 08/31/2018 1324   MCV 99.6 08/31/2018 1324   MCH 34.4 (H) 08/31/2018 1324   MCHC 34.6 08/31/2018 1324   RDW 12.0 08/31/2018 1324   LYMPHSABS 1.7 08/31/2018 1324   MONOABS 0.7 08/31/2018 1324   EOSABS 0.1 08/31/2018 1324   BASOSABS 0.0 08/31/2018 1324    CMP     Component Value Date/Time   NA 139 08/31/2018 1336   K 4.3 08/31/2018 1336   CL 104 08/31/2018 1336   CO2 25 08/31/2018 1324   GLUCOSE 119 (H) 08/31/2018 1336   BUN 16 08/31/2018 1336   CREATININE 1.10 08/31/2018 1336   CALCIUM 10.0 08/31/2018 1324   PROT 7.5 08/31/2018 1324   ALBUMIN 4.2 08/31/2018 1324   AST 28 08/31/2018 1324   ALT 24 08/31/2018 1324   ALKPHOS 57 08/31/2018 1324   BILITOT 1.0 08/31/2018 1324   GFRNONAA >60 08/31/2018 1324   GFRAA >60 08/31/2018 1324    Lipid Panel     Component Value Date/Time   CHOL 181 09/01/2018 0524   TRIG  213 (H) 09/01/2018 0524   HDL 42 09/01/2018 0524   CHOLHDL 4.3 09/01/2018 0524   VLDL 43 (H) 09/01/2018 0524   LDLCALC 96 09/01/2018 0524   A1c pending  Imaging I have reviewed the images obtained:  CT-scan of the brain- normal head CT  Carotid Dopplers and echo pending  MRI examination of the brain--no acute intracranial abnormality.  MRA brain- patent anterior and posterior intracranial circulation no large vessel occlusion.  Assessment: I believe given his transient episode of strokelike symptoms along with his risk factors, he likely  suffered from a TIA.    Recommendations: -Continue stroke work-up including carotid Dopplers and echocardiogram - Continue aspirin and Plavix - Goal LDL is less than 70 and goal A1c is less than 5.7 - Control blood pressure as an outpatient  NEUROHOSPITALIST ADDENDUM Performed a face to face diagnostic evaluation.   I have reviewed the contents of history and physical exam as documented by PA/ARNP/Resident and agree with above documentation.  I have discussed and formulated the above plan as documented. Edits to the note have been made as needed.  Patient presented after having transient left-sided weakness and speech difficulty when he woke up at 3 AM.  Symptoms lasted for few hours.  Regarding speech difficulty, unclear whether this was dysarthria versus aphasia.  Patient states he could think of the words but came out did not make sense. Overall history suggestive of TIA.  TIA stroke work-up negative -MRI did not show any acute findings.  Echocardiogram showed no thrombus, PFO, EF 55-60 pc. .  Carotid Dopplers 1 to 39%.  Patient already is on aspirin and Plavix, as well as high-dose statin.  Offered to enroll patient on [employer] clinical trial, patient declined. Can go home, follow with outpatient neurologist.   Georgiana Spinner Leolia Vinzant MD [facility] [phone]   If 7pm to 7am, please call on call as listed on AMION.

## 2018-09-01 NOTE — Progress Notes (Signed)
  Echocardiogram 2D Echocardiogram has been performed.  Gerda Dissrthur L Dea Bitting 09/01/2018, 12:11 PM

## 2018-09-01 NOTE — Plan of Care (Signed)
Adequate for discvharge

## 2018-09-01 NOTE — Progress Notes (Signed)
Carotid artery duplex has been completed. 1-39% ICA stenosis bilaterally.  09/01/18 3:22 PM Olen CordialGreg Kae Lauman RVT

## 2018-09-01 NOTE — Evaluation (Signed)
Occupational Therapy Evaluation and Discharge Patient Details Name: [name] MRN: [id number] DOB: 1946/04/06 Today's Date: 09/01/2018    History of Present Illness Pt is a 72 y.o. male with history of hypertension, GERD, coronary aretry disease with CABG angina. Pt presented to the ED with difficulty moving his L arm and L facial drooping. No acute abnormalities noted on imaging.    Clinical Impression   Patient evaluated by Occupational Therapy with no further acute OT needs identified. He is able to complete ADL with independence in hospital setting and demonstrate functional cognition. All education has been completed and the patient has no further questions. See below for any follow-up Occupational Therapy or equipment needs. OT to sign off. Thank you for referral.      Follow Up Recommendations  No OT follow up;Supervision - Intermittent    Equipment Recommendations  None recommended by OT    Recommendations for Other Services       Precautions / Restrictions Precautions Precautions: None Restrictions Weight Bearing Restrictions: No      Mobility Bed Mobility Overal bed mobility: Independent                Transfers Overall transfer level: Independent                    Balance Overall balance assessment: No apparent balance deficits (not formally assessed)                                         ADL either performed or assessed with clinical judgement   ADL Overall ADL's : Independent                                       General ADL Comments: Completing ADL tasks independently this session.      Vision Baseline Vision/History: Wears glasses Wears Glasses: At all times Patient Visual Report: No change from baseline Vision Assessment?: No apparent visual deficits Additional Comments: Using vision functionally throughout session.      Perception     Praxis      Pertinent Vitals/Pain        Hand Dominance     Extremity/Trunk Assessment Upper Extremity Assessment Upper Extremity Assessment: Overall WFL for tasks assessed   Lower Extremity Assessment Lower Extremity Assessment: Overall WFL for tasks assessed       Communication Communication Communication: No difficulties   Cognition Arousal/Alertness: Awake/alert Behavior During Therapy: WFL for tasks assessed/performed Overall Cognitive Status: Within Functional Limits for tasks assessed                                     General Comments       Exercises     Shoulder Instructions      Home Living Family/patient expects to be discharged to:: Private residence Living Arrangements: Spouse/significant other Available Help at Discharge: Family;Available 24 hours/day Type of Home: House Home Access: Stairs to enter [employer] of Steps: 5 Entrance Stairs-Rails: Left Home Layout: One level     Bathroom Shower/Tub: Tub/shower unit;Walk-in shower(uses both)   Bathroom Toilet: Standard     Home Equipment: Grab bars - tub/shower;Shower seat          Prior Functioning/Environment Level  of Independence: Independent        Comments: Working        OT Problem List: Decreased strength      OT Treatment/Interventions:      OT Goals(Current goals can be found in the care plan section) Acute Rehab OT Goals Patient Stated Goal: to go home today OT Goal Formulation: With patient/family  OT Frequency:     Barriers to D/C:            Co-evaluation              AM-PAC PT "6 Clicks" Daily Activity     Outcome Measure Help from another person eating meals?: None Help from another person taking care of personal grooming?: None Help from another person toileting, which includes using toliet, bedpan, or urinal?: None Help from another person bathing (including washing, rinsing, drying)?: None Help from another person to put on and taking off regular upper body  clothing?: None Help from another person to put on and taking off regular lower body clothing?: None 6 Click Score: 24   End of Session Nurse Communication: Mobility status  Activity Tolerance: Patient tolerated treatment well Patient left: in chair;with call bell/phone within reach;with family/visitor present  OT Visit Diagnosis: Other abnormalities of gait and mobility (R26.89)                Time: [id number] OT Time Calculation (min): 17 min Charges:  OT General Charges $OT Visit: 1 Visit OT Evaluation $OT Eval Low Complexity: 1 Low  [name], [name] Acute Rehabilitation Services Pager [phone] Office [phone]   [facility] HospitalCharity A Loree Wheeler 09/01/2018, 9:54 AM

## 2018-09-01 NOTE — Evaluation (Signed)
Speech Language Pathology Evaluation Patient Details Name: Tony LintsCharles D Sou MRN: [id number] DOB: 03/05/1946 Today's Date: 09/01/2018 Time: 1000-1015 SLP Time Calculation (min) (ACUTE ONLY): 15 min  Problem List:  Patient Active Problem List   Diagnosis Date Noted  . TIA (transient ischemic attack) 08/31/2018  . CAD (coronary artery disease) 08/31/2018  . HLD (hyperlipidemia) 08/31/2018  . GERD (gastroesophageal reflux disease) 08/31/2018  . Acute coronary syndrome (HCC) 03/30/2012   Past Medical History:  Past Medical History:  Diagnosis Date  . Angina   . Coronary artery disease   . GERD (gastroesophageal reflux disease)   . Hypertension    Past Surgical History:  Past Surgical History:  Procedure Laterality Date  . CORONARY ANGIOPLASTY WITH STENT PLACEMENT    . CORONARY ARTERY BYPASS GRAFT    . HERNIA REPAIR    . KNEE ARTHROSCOPY    . LEFT HEART CATHETERIZATION WITH CORONARY ANGIOGRAM N/A 03/31/2012   Procedure: LEFT HEART CATHETERIZATION WITH CORONARY ANGIOGRAM;  Surgeon: Robynn PaneMohan N Harwani, MD;  Location: [facility] CATH [facility];  Service: Cardiovascular;  Laterality: N/A;  . TONSILLECTOMY     HPI:  72 year old male admitted 08/31/18 with expressive aphasia, dysarthria, and difficulty moving left arm, now fully resolved. PMH: HTN, GERD, CAD/CABG, angineMRI = no acute intracranial abnormality   Assessment / Plan / Recommendation Clinical Impression  Pt presents at baseline for cognitive/linguistic function. Pt exhibits no dysarthria, and is fully communicative. All symptoms have resolved. No further ST intervention is recommended at this time. Please reconsult if needs arise.    SLP Assessment  SLP Recommendation/Assessment: Patient does not need any further Speech Language Pathology Services SLP Visit Diagnosis: Cognitive communication deficit (R41.841)    Follow Up Recommendations  (HH or OP ST if needs arise after DC)       SLP Evaluation Cognition  Overall Cognitive Status:  Within Functional Limits for tasks assessed       Comprehension  Auditory Comprehension Overall Auditory Comprehension: Appears within functional limits for tasks assessed    Expression Expression Primary Mode of Expression: Verbal Verbal Expression Overall Verbal Expression: Appears within functional limits for tasks assessed   Oral / Motor  Oral Motor/Sensory Function Overall Oral Motor/Sensory Function: Within functional limits Motor Speech Overall Motor Speech: Appears within functional limits for tasks assessed   GO                   Alleah Dearman B. Murvin NatalBueche, [facility] AssociationMSP, CCC-SLP Speech Language Pathologist [phone]  Leigh AuroraBueche, Andres Bantz Brown 09/01/2018, 10:19 AM

## 2018-09-01 NOTE — Evaluation (Signed)
Physical Therapy Evaluation Patient Details Name: [name] MRN: [id number] DOB: [birth year] Today's Date: 09/01/2018   History of Present Illness  Pt is a 72 y.o. male with history of hypertension, GERD, coronary aretry disease with CABG angina. Pt presented to the ED with difficulty moving his L arm and L facial drooping. No acute abnormalities noted on imaging.   Clinical Impression  Patient presents close to functional baseline.  Not high fall risk per DGI (score 21/24), however reports decreased balance in the evenings due to "inner ear."  Discussed compensation and why may be worse in evening.  Also educated to ask for vestibular rehab referral if needed in the future.  PT to sign off.    Follow Up Recommendations No PT follow up    Equipment Recommendations  None recommended by PT    Recommendations for Other Services       Precautions / Restrictions Precautions Precautions: None Restrictions Weight Bearing Restrictions: No      Mobility  Bed Mobility Overal bed mobility: Independent                Transfers Overall transfer level: Independent                  Ambulation/Gait Ambulation/Gait assistance: Independent Gait Distance (Feet): 250 Feet Assistive device: None Gait Pattern/deviations: Step-through pattern;Drifts right/left     General Gait Details: initially drifting some to sides, but performed DGI and noted minimal veering with head turns; also L arm drifting up during ambulation but corrects with cues  Stairs Stairs: Yes Stairs assistance: Modified independent (Device/Increase time) Stair Management: One rail Left;Forwards;Alternating pattern Number of Stairs: 4    Wheelchair Mobility    Modified Rankin (Stroke Patients Only) Modified Rankin (Stroke Patients Only) Pre-Morbid Rankin Score: No symptoms Modified Rankin: Slight disability     Balance Overall balance assessment: Modified Independent                               Standardized Balance Assessment Standardized Balance Assessment : Dynamic Gait Index   Dynamic Gait Index Level Surface: Mild Impairment Change in Gait Speed: Normal Gait with Horizontal Head Turns: Normal Gait with Vertical Head Turns: Mild Impairment Gait and Pivot Turn: Normal Step Over Obstacle: Normal Step Around Obstacles: Normal Steps: Mild Impairment Total Score: 21       Pertinent Vitals/Pain Pain Assessment: No/denies pain    Home Living Family/patient expects to be discharged to:: Private residence Living Arrangements: Spouse/significant other Available Help at Discharge: Family;Available 24 hours/day Type of Home: House Home Access: Stairs to enter Entrance Stairs-Rails: Left Entrance Stairs-Number of Steps: 5 Home Layout: One level Home Equipment: Grab bars - tub/shower;Shower seat;Walker - 2 wheels;Cane - single point;Cane - quad Additional Comments: walker & canes were mother's    Prior Function Level of Independence: Independent         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  Education performed about why it may be worse at night, how to compensate with potentially bright lighting and taking his time and using cane when feeling more off balance.  Also education about therapy for vestibular rehab if needed in  future    Exercises     Assessment/Plan    PT Assessment Patent does not need any further PT services  PT Problem List         PT Treatment Interventions      PT Goals (Current goals can be found in the Care Plan section)  Acute Rehab PT Goals Patient Stated Goal: to go home today PT Goal Formulation: All assessment and education complete, DC therapy    Frequency     Barriers to discharge        Co-evaluation               AM-PAC PT "6 Clicks" Daily Activity  Outcome Measure Difficulty turning over in bed (including adjusting bedclothes, sheets and blankets)?: None Difficulty moving from lying on back to sitting on the side of the bed? : None Difficulty sitting down on and standing up from a chair with arms (e.g., wheelchair, bedside commode, etc,.)?: None Help needed moving to and from a bed to chair (including a wheelchair)?: None Help needed walking in hospital room?: None Help needed climbing 3-5 steps with a railing? : A Little 6 Click Score: 23    End of Session Equipment Utilized During Treatment: Gait belt Activity Tolerance: Patient tolerated treatment well Patient left: in bed;with call bell/phone within reach;with family/visitor present   PT Visit Diagnosis: Other abnormalities of gait and mobility (R26.89)    Time: 1010-1031 PT Time Calculation (min) (ACUTE ONLY): 21 min   Charges:   PT Evaluation $PT Eval Low Complexity: 1 Low          Sheran LawlessCyndi Wheeler, [place] CarolinaPT Acute Rehabilitation Services [phone] 09/01/2018   [name] 09/01/2018, 10:41 AM

## 2018-09-02 LAB — HEMOGLOBIN A1C
Hgb A1c MFr Bld: 5.8 % — ABNORMAL HIGH (ref 4.8–5.6)
Mean Plasma Glucose: 120 mg/dL

## 2018-09-04 NOTE — Discharge Summary (Signed)
[facility] Discharge Summary   Patient: [name] WUJ:[id number]   PCP: Tony Mask, MD DOB: 19-Jul-1946   Date of admission: 08/31/2018   Date of discharge: 09/01/2018     Discharge Diagnoses:  Principal Problem:   TIA (transient ischemic attack) Active Problems:   CAD (coronary artery disease)   HLD (hyperlipidemia)   GERD (gastroesophageal reflux disease)   Admitted From: home Disposition:  home  Recommendations for Outpatient Follow-up:  1. Please follow up with PCP in 1 week   Follow-up Information    Tony Mask, MD. Schedule an appointment as soon as possible for a visit in 1 week(s).   Specialty:  Family Medicine Contact information: [address] [place] [address] [phone]          Diet recommendation: cardai cdiet  Activity: The patient is advised to gradually reintroduce usual activities.  Discharge Condition: good  Code Status: full code  History of present illness: As per the H and P dictated on admission, "[name] is a 72 y.o. male with medical history significant of CAD, stent placement, CABG, GERD, hypertension, hyperlipidemia, who presents with slurred speech, left sided weakness.  Patient was last known normal at about10 PM last night. He states that he awoke at 3 AM from a bad dream, and noted left sided weakness and slurred speech at about 3:00 am. He states that he had difficulty walking. He went back to sleep. She woke around 8 AM and called his primary care physician. He was told he could come in around noon. When they woke up to get ready for his doctor's appointment his symptoms had improved, His symptoms lasted from 3 AM until sometime in the mid morning, around 10:00 AM. He was told to come to the ED for further evaluation and treatment. Patient states he had some symptoms of URI last week. Currently his symptoms have resolved.  At this time, patient does not have slurred speech, facial  droop, unilateral weakness or numbness in extremities.  No vision change or hearing loss.  Patient denies chest pain, shortness breath, cough, fever or chills.  He has mild nausea, no vomiting, diarrhea or abdominal pain denies symptoms of UTI."  Hospital Course:  Summary of his active problems in the hospital is as following. TIA (transient ischemic attack): Patient symptoms are concerning for TIA.  CT head is negative for acute intracranial abnormalities.  MRI of the brain did not show acute stroke.  His symptoms have resolved currently.  -Atrial fibrillation: not present  -tPA was not given because symptoms are out of window for tPA - MRA of the brain without contrast unremarkable  - PT consult, OT consult recommended home,  No PT follow up needed  - Bedside swallowing screen was not needed - 2 d Echocardiogram ef prserved, no valvular abnormality  - Carotid dopplers negative - pt is on Aspirin and plvix - switched pravastatin to Lipitor 80 mg daily Neurology was consulted and recommended no further work up or change in medicines necessary   CAD (coronary artery disease): s/p of stent and CABG. No CP. -Continue aspirin, Plavix - Lipitor -prn NTG  HLD (hyperlipidemia): -Lipitor  GERD (gastroesophageal reflux disease) -pepcid  Anxiety: -prn Xanax  All other chronic medical condition were stable during the hospitalization.  Patient was seen by physical therapy, who recommended no PT follow up needed. On the day of the discharge the patient's vitals were stable, and no other acute medical condition were reported by  patient. the patient was felt safe to be discharge at home with family.  Consultants: neurology  Procedures: Echocardiogram   DISCHARGE MEDICATION: Allergies as of 09/01/2018      Reactions   Other Other (See Comments)   Febreze(lavender) causes itching       Medication List    STOP taking these medications   pravastatin 40 MG tablet Commonly known as:   PRAVACHOL     TAKE these medications   acetaminophen 650 MG CR tablet Commonly known as:  TYLENOL Take 650 mg by mouth 3 (three) times daily. For arthritis pain   amLODipine 5 MG tablet Commonly known as:  NORVASC Take 5 mg by mouth daily.   aspirin EC 81 MG tablet Take 81 mg by mouth at bedtime.   atorvastatin 80 MG tablet Commonly known as:  LIPITOR Take 1 tablet (80 mg total) by mouth daily at 6 PM.   clopidogrel 75 MG tablet Commonly known as:  PLAVIX Take 75 mg by mouth daily.   [employer] [facility] WORT 150 MG Caps Generic drug:  [facility] Johns Wort Take 300 mg by mouth every morning.   doxazosin 1 MG tablet Commonly known as:  CARDURA Take 1 mg by mouth at bedtime.   EPINEPHrine 0.3 mg/0.3 mL Soaj injection Commonly known as:  EPI-PEN Inject 0.3 mLs (0.3 mg total) into the muscle once as needed (for severe allergic reaction). CAll 911 immediately if you have to use this medicine   FISH OIL PO Take 1,200 mg by mouth daily. Patient takes 1 in the morning, 1 at lunchtime, 2 with dinner   metoprolol succinate 50 MG 24 hr tablet Commonly known as:  TOPROL-XL Take 1 tablet (50 mg total) by mouth daily. Patient takes at lunchtime Take with or immediately following a meal. What changed:  how much to take   multivitamin with minerals Tabs tablet Take 1 tablet by mouth daily.   nitroGLYCERIN 0.4 MG SL tablet Commonly known as:  NITROSTAT Place 0.4 mg under the tongue every 5 (five) minutes as needed. For chest pain   ranitidine 150 MG tablet Commonly known as:  ZANTAC Take 150 mg by mouth 2 (two) times daily as needed for heartburn.   sertraline 25 MG tablet Commonly known as:  ZOLOFT Take 25 mg by mouth daily.      Allergies  Allergen Reactions  . Other Other (See Comments)    Febreze(lavender) causes itching    Discharge Instructions    Diet - low sodium heart healthy   Complete by:  As directed    Discharge instructions   Complete by:  As directed    It is  important that you read following instructions as well as go over your medication list with RN to help you understand your care after this hospitalization.  Discharge Instructions: Please follow-up with PCP in one week  Please request your primary care physician to go over all Hospital Tests and Procedure/Radiological results at the follow up,  Please get all Hospital records sent to your PCP by signing hospital release before you go home.  Do not take more than prescribed Pain, Sleep and Anxiety Medications. You were cared for by a hospitalist during your hospital stay. If you have any questions about your discharge medications or the care you received while you were in the hospital after you are discharged, you can call the unit and ask to speak with the hospitalist on call if the hospitalist that took care of you is  not available.  Once you are discharged, your primary care physician will handle any further medical issues. Please note that NO REFILLS for any discharge medications will be authorized once you are discharged, as it is imperative that you return to your primary care physician (or establish a relationship with a primary care physician if you do not have one) for your aftercare needs so that they can reassess your need for medications and monitor your lab values. You Must read complete instructions/literature along with all the possible adverse reactions/side effects for all the Medicines you take and that have been prescribed to you. Take any new Medicines after you have completely understood and accept all the possible adverse reactions/side effects. Wear Seat belts while driving. If you have smoked or chewed Tobacco in the last 2 yrs please stop smoking and/or stop any Recreational drug use.   Increase activity slowly   Complete by:  As directed      Discharge Exam: There were no vitals filed for this visit. Vitals:   09/01/18 0426 09/01/18 0845  BP:  (!) 150/70  Pulse:      Resp:    Temp: 98.2 F (36.8 C)   SpO2:     General: Appear in no distress, no Rash; Oral Mucosa moist. Cardiovascular: S1 and S2 Present, no Murmur, no JVD Respiratory: Bilateral Air entry present and Clear to Auscultation, no Crackles, no wheezes Abdomen: Bowel Sound present, Soft and no tenderness Extremities: no Pedal edema, o calf tenderness Neurology: Grossly no focal neuro deficit.  The results of significant diagnostics from this hospitalization (including imaging, microbiology, ancillary and laboratory) are listed below for reference.    Significant Diagnostic Studies: Ct Head Wo Contrast  Result Date: 08/31/2018 CLINICAL DATA:  Left-sided facial droop and slurred speech for several hours EXAM: CT HEAD WITHOUT CONTRAST TECHNIQUE: Contiguous axial images were obtained from the base of the skull through the vertex without intravenous contrast. COMPARISON:  None. FINDINGS: Brain: No evidence of acute infarction, hemorrhage, hydrocephalus, extra-axial collection or mass lesion/mass effect. Vascular: No hyperdense vessel or unexpected calcification. Skull: Normal. Negative for fracture or focal lesion. Sinuses/Orbits: No acute finding. Other: None. IMPRESSION: Normal head CT Electronically Signed   By: Alcide CleverMark  Lukens M.D.   On: 08/31/2018 13:51   Mr Brain Wo Contrast  Result Date: 08/31/2018 CLINICAL DATA:  72 y/o M; TIA, initial exam. Episode of slurred speech and left arm numbness lasting approximately 2-3 hours. EXAM: MRI HEAD WITHOUT CONTRAST TECHNIQUE: Multiplanar, multiecho pulse sequences of the brain and surrounding structures were obtained without intravenous contrast. COMPARISON:  08/31/2018 CT head.  10/03/2012 MRI head. FINDINGS: Brain: No acute infarction, hemorrhage, hydrocephalus, extra-axial collection or mass lesion. Few stable nonspecific T2 FLAIR hyperintensities in subcortical and periventricular white matter are compatible with mild chronic microvascular ischemic changes  for age. Mild stable volume loss of the brain. Vascular: Normal flow voids. Skull and upper cervical spine: Normal marrow signal. Sinuses/Orbits: Negative. Other: None. IMPRESSION: 1. No acute intracranial abnormality. 2. Stable mild chronic microvascular ischemic changes and volume loss of the head. Electronically Signed   By: Mitzi HansenLance  Furusawa-Stratton M.D.   On: 08/31/2018 18:18   Mr Shirlee LatchMra Head VHWo Contrast  Result Date: 08/31/2018 CLINICAL DATA:  72 y/o M; TIA, initial exam. Episode of slurred speech and left arm numbness lasting approximately 2-3 hours. EXAM: MRA HEAD WITHOUT CONTRAST TECHNIQUE: Angiographic images of the Circle of Willis were obtained using MRA technique without intravenous contrast. COMPARISON:  08/31/2018 MRI of the head.  08/31/2018 CT of the head. FINDINGS: Internal carotid arteries:  Patent. Anterior cerebral arteries:  Patent. Middle cerebral arteries: Patent. Anterior communicating artery: Not identified, likely hypoplastic or absent. Posterior communicating arteries: Fetal left PCA. Right PCA not identified, likely hypoplastic or absent. Posterior cerebral arteries:  Patent. Basilar artery:  Patent. Vertebral arteries:  Patent. No evidence of high-grade stenosis, large vessel occlusion, or aneurysm identified. IMPRESSION: Patent anterior and posterior intracranial circulation. No large vessel occlusion, aneurysm, or significant stenosis. Electronically Signed   By: Mitzi Hansen M.D.   On: 08/31/2018 21:47    Microbiology: No results found for this or any previous visit (from the past 240 hour(s)).   Labs: CBC: Recent Labs  Lab 08/31/18 1324 08/31/18 1336  WBC 6.5  --   NEUTROABS 3.9  --   HGB 16.0 16.3  HCT 46.3 48.0  MCV 99.6  --   PLT 208  --    Basic Metabolic Panel: Recent Labs  Lab 08/31/18 1324 08/31/18 1336  NA 139 139  K 4.3 4.3  CL 103 104  CO2 25  --   GLUCOSE 122* 119*  BUN 14 16  CREATININE 1.09 1.10  CALCIUM 10.0  --    Liver  Function Tests: Recent Labs  Lab 08/31/18 1324  AST 28  ALT 24  ALKPHOS 57  BILITOT 1.0  PROT 7.5  ALBUMIN 4.2   No results for input(s): LIPASE, AMYLASE in the last 168 hours. No results for input(s): AMMONIA in the last 168 hours. Cardiac Enzymes: No results for input(s): CKTOTAL, CKMB, CKMBINDEX, TROPONINI in the last 168 hours. BNP (last 3 results) No results for input(s): BNP in the last 8760 hours. CBG: No results for input(s): GLUCAP in the last 168 hours. Time spent: 35 minutes  Signed:  Lynden Oxford  [facility] 09/01/2018   , 1:04 AM

## 2018-10-19 ENCOUNTER — Ambulatory Visit (INDEPENDENT_AMBULATORY_CARE_PROVIDER_SITE_OTHER): Payer: BLUE CROSS/BLUE SHIELD | Admitting: Neurology

## 2018-10-19 ENCOUNTER — Encounter: Payer: Self-pay | Admitting: Neurology

## 2018-10-19 VITALS — BP 135/77 | HR 59 | Ht 63.0 in | Wt 145.8 lb

## 2018-10-19 DIAGNOSIS — G459 Transient cerebral ischemic attack, unspecified: Secondary | ICD-10-CM | POA: Diagnosis not present

## 2018-10-19 MED ORDER — COENZYME Q10 200 MG PO CAPS: 200.0000 mg | ORAL_CAPSULE | Freq: Every day | ORAL | 1 refills | Status: AC

## 2018-10-19 NOTE — Progress Notes (Signed)
[facility] [address] [place]. [place] [address] [phone]       OFFICE CONSULT NOTE  Mr. [name] Date of Birth:  Apr 25, 1946 Medical Record Number:  [id number]   Referring MD: Lance Bosch, NP  Reason for Referral: TIA HPI: [name] is a pleasant 72 year old Caucasian male seen today for initial office consultation visit for TIA.  History is obtained from the patient and review of electronic medical records.  I personally reviewed imaging films in PACS.  He states he woke up on 08/31/2018 at 3 AM which is a usual time for him and noticed that his left arm was weak.  He tried to wake up his wife and had trouble speaking and expressing himself.  Patient also felt that he could not concentrate.  He decided not to go to walk and fell asleep.  When he woke up 2 hours later he noticed he could speak better but his left arm was still little weak but getting better.  They called his primary physician who asked him to go to the emergency room right away.  He was seen by Dr. Laurence Slate neuro hospitalist on-call cardiac the TIA.  MRI scan of the brain was obtained which I personally reviewed showed no acute abnormality and only age-appropriate changes of chronic microvascular disease and mild degree of generalized cerebral atrophy.  MRA of the brain showed no significant intracranial stenosis or occlusion.  Carotid ultrasound showed no significant extracranial stenosis.  Transthoracic echo was unremarkable.  LDL cholesterol was slightly elevated at 96 mg percent.  Hemoglobin A1c was 5.8.  Patient was on aspirin and Plavix because of history of drug-coated cardiac stents which was continued.  He was on pravastatin 40 mg which was changed to Lipitor 80 mg daily.  Patient states that he did have a history of Lipitor related myalgias in the past and hence had been switched to pravastatin.   Marland Kitchen  He still working about 8 hours/day but complains of increased fatigue recently.  He is had no  significant bleeding but bruises with aspirin and Plavix.  He has no new complaints today.  He denies any prior history of strokes TIAs seizures or other significant neurological problems.  He has a history of chronic tinnitus and vertigo which is stable  ROS:   14 system review of systems is positive for fatigue, vertigo, tinnitus, and review of [place] easy bruising and bleeding, cramps and all other systems negative  PMH:  Past Medical History:  Diagnosis Date  . Angina   . Coronary artery disease   . GERD (gastroesophageal reflux disease)   . Hypertension     Social History:  Social History   Socioeconomic History  . Marital status: Married    Spouse name: Not on file  . Number of children: Not on file  . Years of education: Not on file  . Highest education level: Not on file  Occupational History  . Not on file  Social Needs  . Financial resource strain: Not on file  . Food insecurity:    Worry: Not on file    Inability: Not on file  . Transportation needs:    Medical: Not on file    Non-medical: Not on file  Tobacco Use  . Smoking status: Never Smoker  . Smokeless tobacco: Never Used  Substance and Sexual Activity  . Alcohol use: No  . Drug use: No  . Sexual activity: Not Currently  Lifestyle  . Physical activity:  Days per week: Not on file    Minutes per session: Not on file  . Stress: Not on file  Relationships  . Social connections:    Talks on phone: Not on file    Gets together: Not on file    Attends religious service: Not on file    Active member of club or organization: Not on file    Attends meetings of clubs or organizations: Not on file    Relationship status: Not on file  . Intimate partner violence:    Fear of current or ex partner: Not on file    Emotionally abused: Not on file    Physically abused: Not on file    Forced sexual activity: Not on file  Other Topics Concern  . Not on file  Social History Narrative  . Not on file     Medications:   Current Outpatient Medications on File Prior to Visit  Medication Sig Dispense Refill  . acetaminophen (TYLENOL) 650 MG CR tablet Take 650 mg by mouth 3 (three) times daily. For arthritis pain    . amLODipine (NORVASC) 5 MG tablet Take 5 mg by mouth daily.  2  . aspirin EC 81 MG tablet Take 81 mg by mouth at bedtime.     Marland Kitchen atorvastatin (LIPITOR) 80 MG tablet Take 1 tablet (80 mg total) by mouth daily at 6 PM. 30 tablet 0  . clopidogrel (PLAVIX) 75 MG tablet Take 75 mg by mouth daily.    Marland Kitchen doxazosin (CARDURA) 1 MG tablet Take 1 mg by mouth at bedtime.     Marland Kitchen EPINEPHrine (EPIPEN 2-PAK) 0.3 mg/0.3 mL IJ SOAJ injection Inject 0.3 mLs (0.3 mg total) into the muscle once as needed (for severe allergic reaction). CAll 911 immediately if you have to use this medicine 1 Device 1  . metoprolol succinate (TOPROL-XL) 50 MG 24 hr tablet Take 1 tablet (50 mg total) by mouth daily. Patient takes at lunchtime Take with or immediately following a meal. (Patient taking differently: Take 25 mg by mouth daily. Patient takes at lunchtime Take with or immediately following a meal.) 3 tablet 30  . Multiple Vitamin (MULTIVITAMIN WITH MINERALS) TABS tablet Take 1 tablet by mouth daily.    . nitroGLYCERIN (NITROSTAT) 0.4 MG SL tablet Place 0.4 mg under the tongue every 5 (five) minutes as needed. For chest pain    . Omega-3 Fatty Acids (FISH OIL PO) Take 1,200 mg by mouth daily. Patient takes 1 in the morning, 1 at lunchtime, 2 with dinner     . ranitidine (ZANTAC) 150 MG tablet Take 150 mg by mouth 2 (two) times daily as needed for heartburn.    . sertraline (ZOLOFT) 25 MG tablet Take 25 mg by mouth daily.  2  . St Johns Wort ([employer] [facility] [employer]OH[employer] WORT) 150 MG CAPS Take 300 mg by mouth every morning.     No current facility-administered medications on file prior to visit.     Allergies:   Allergies  Allergen Reactions  . Other Other (See Comments)    Febreze(lavender) causes itching     Physical  Exam General: frail elderly caucasian male, seated, in no evident distress Head: head normocephalic and atraumatic.   Neck: supple with no carotid or supraclavicular bruits Cardiovascular: regular rate and rhythm, no murmurs Musculoskeletal: mild kyphoscoliosis Skin:  no rash/petichiae Vascular:  Normal pulses all extremities  Neurologic Exam Mental Status: Awake and fully alert. Oriented to place and time. Recent and remote memory intact.  Attention span, concentration and fund of knowledge appropriate. Mood and affect appropriate.  Cranial Nerves: Fundoscopic exam reveals sharp disc margins. Pupils equal, briskly reactive to light. Extraocular movements full without nystagmus. Visual fields full to confrontation. Hearing intact. Facial sensation intact. Face, tongue, palate moves normally and symmetrically.  Motor: Normal bulk and tone. Normal strength in all tested extremity muscles. Sensory.: intact to touch , pinprick , position and vibratory sensation.  Coordination: Rapid alternating movements normal in all extremities. Finger-to-nose and heel-to-shin performed accurately bilaterally. Gait and Station: Arises from chair without difficulty. Stance is normal. Gait demonstrates normal stride length and balance . Able to heel, toe and tandem walk with t difficulty.  Reflexes: 1+ and symmetric. Toes downgoing.   NIHSS 0 Modified Rankin  0   ASSESSMENT: 35 year patient with episode of transient speech difficulties and left arm weakness likely a right hemispheric TIA in September 2019 etiology possibly small vessel disease.  Vascular risk factors of hypertension, hyperlipidemia and age     PLAN: I had a long d/w patient about his recent TIA,risk for recurrent stroke/TIAs, personally independently reviewed imaging studies and stroke evaluation results and answered questions.Continue aspirin 81 mg daily and Plavixl 75 mg daily  for secondary stroke prevention given h/o drug coated cardiac  stents and maintain strict control of hypertension with blood pressure goal below 130/90, diabetes with hemoglobin A1c goal below 6.5% and lipids with LDL cholesterol goal below 70 mg/dL. I also advised the patient to eat a healthy diet with plenty of whole grains, cereals, fruits and vegetables, exercise regularly and maintain ideal body weight . Add CoQ10 200 mg daily to prevent statin myalgias.  Greater than 50% time during this 45-minute consultation visit was spent on counseling and coordination of care about TIA and discussion about stroke prevention and treatment and answering questions.Followup in the future with my nurse practitioner Shanda Bumps in 3 months or call earlier  f necessary Delia Heady, MD Medical Director [facility] Pager: [phone] 10/19/2018 4:40 PM Note: This document was prepared with digital dictation and possible smart phrase technology. Any transcriptional errors that result from this process are unintentional.

## 2018-10-19 NOTE — Patient Instructions (Addendum)
I had a long d/w patient about his recent TIA,risk for recurrent stroke/TIAs, personally independently reviewed imaging studies and stroke evaluation results and answered questions.Continue aspirin 81 mg daily and Plavixl 75 mg daily  for secondary stroke prevention given h/o drug coated cardiac stents and maintain strict control of hypertension with blood pressure goal below 130/90, diabetes with hemoglobin A1c goal below 6.5% and lipids with LDL cholesterol goal below 70 mg/dL. I also advised the patient to eat a healthy diet with plenty of whole grains, cereals, fruits and vegetables, exercise regularly and maintain ideal body weight . Add CoQ10 200 mg daily to prevent statin myalgias.Followup in the future with my nurse practitioner Shanda Bumps in 3 months or call earlier  f necessary  Stroke Prevention Some medical conditions and behaviors are associated with a higher chance of having a stroke. You can help prevent a stroke by making nutrition, lifestyle, and other changes, including managing any medical conditions you may have. What nutrition changes can be made?  Eat healthy foods. You can do this by: ? Choosing foods high in fiber, such as fresh fruits and vegetables and whole grains. ? Eating at least 5 or more servings of fruits and vegetables a day. Try to fill half of your plate at each meal with fruits and vegetables. ? Choosing lean protein foods, such as lean cuts of meat, poultry without skin, fish, tofu, beans, and nuts. ? Eating low-fat dairy products. ? Avoiding foods that are high in salt (sodium). This can help lower blood pressure. ? Avoiding foods that have saturated fat, trans fat, and cholesterol. This can help prevent high cholesterol. ? Avoiding processed and premade foods.  Follow your health care provider's specific guidelines for losing weight, controlling high blood pressure (hypertension), lowering high cholesterol, and managing diabetes. These may include: ? Reducing your  daily calorie intake. ? Limiting your daily sodium intake to 1,500 milligrams (mg). ? Using only healthy fats for cooking, such as olive oil, canola oil, or sunflower oil. ? Counting your daily carbohydrate intake. What lifestyle changes can be made?  Maintain a healthy weight. Talk to your health care provider about your ideal weight.  Get at least 30 minutes of moderate physical activity at least 5 days a week. Moderate activity includes brisk walking, biking, and swimming.  Do not use any products that contain nicotine or tobacco, such as cigarettes and e-cigarettes. If you need help quitting, ask your health care provider. It may also be helpful to avoid exposure to secondhand smoke.  Limit alcohol intake to no more than 1 drink a day for nonpregnant women and 2 drinks a day for men. One drink equals 12 oz of beer, 5 oz of wine, or 1 oz of hard liquor.  Stop any illegal drug use.  Avoid taking birth control pills. Talk to your health care provider about the risks of taking birth control pills if: ? You are over 34 years old. ? You smoke. ? You get migraines. ? You have ever had a blood clot. What other changes can be made?  Manage your cholesterol levels. ? Eating a healthy diet is important for preventing high cholesterol. If cholesterol cannot be managed through diet alone, you may also need to take medicines. ? Take any prescribed medicines to control your cholesterol as told by your health care provider.  Manage your diabetes. ? Eating a healthy diet and exercising regularly are important parts of managing your blood sugar. If your blood sugar cannot be managed through  diet and exercise, you may need to take medicines. ? Take any prescribed medicines to control your diabetes as told by your health care provider.  Control your hypertension. ? To reduce your risk of stroke, try to keep your blood pressure below 130/80. ? Eating a healthy diet and exercising regularly are an  important part of controlling your blood pressure. If your blood pressure cannot be managed through diet and exercise, you may need to take medicines. ? Take any prescribed medicines to control hypertension as told by your health care provider. ? Ask your health care provider if you should monitor your blood pressure at home. ? Have your blood pressure checked every year, even if your blood pressure is normal. Blood pressure increases with age and some medical conditions.  Get evaluated for sleep disorders (sleep apnea). Talk to your health care provider about getting a sleep evaluation if you snore a lot or have excessive sleepiness.  Take over-the-counter and prescription medicines only as told by your health care provider. Aspirin or blood thinners (antiplatelets or anticoagulants) may be recommended to reduce your risk of forming blood clots that can lead to stroke.  Make sure that any other medical conditions you have, such as atrial fibrillation or atherosclerosis, are managed. What are the warning signs of a stroke? The warning signs of a stroke can be easily remembered as BEFAST.  B is for balance. Signs include: ? Dizziness. ? Loss of balance or coordination. ? Sudden trouble walking.  E is for eyes. Signs include: ? A sudden change in vision. ? Trouble seeing.  F is for face. Signs include: ? Sudden weakness or numbness of the face. ? The face or eyelid drooping to one side.  A is for arms. Signs include: ? Sudden weakness or numbness of the arm, usually on one side of the body.  S is for speech. Signs include: ? Trouble speaking (aphasia). ? Trouble understanding.  T is for time. ? These symptoms may represent a serious problem that is an emergency. Do not wait to see if the symptoms will go away. Get medical help right away. Call your local emergency services (911 in the [place] Do not drive yourself to the hospital.  Other signs of stroke may include: ? A sudden,  severe headache with no known cause. ? Nausea or vomiting. ? Seizure.  Where to find more information: For more information, visit:  American Stroke Association: www.strokeassociation.org  National Stroke Association: www.stroke.org  Summary  You can prevent a stroke by eating healthy, exercising, not smoking, limiting alcohol intake, and managing any medical conditions you may have.  Do not use any products that contain nicotine or tobacco, such as cigarettes and e-cigarettes. If you need help quitting, ask your health care provider. It may also be helpful to avoid exposure to secondhand smoke.  Remember BEFAST for warning signs of stroke. Get help right away if you or a loved one has any of these signs. This information is not intended to replace advice given to you by your health care provider. Make sure you discuss any questions you have with your health care provider. Document Released: 01/09/2005 Document Revised: 01/07/2017 Document Reviewed: 01/07/2017 Elsevier Interactive Patient Education  [employer].

## 2018-12-22 ENCOUNTER — Telehealth: Payer: Self-pay | Admitting: Neurology

## 2018-12-22 NOTE — Telephone Encounter (Signed)
Advised 

## 2018-12-22 NOTE — Telephone Encounter (Signed)
[name] with [facility] called stating the pt had a TIA this morning 12/22/18 they increased pts aspirin EC 81 MG tablet dosage. Requesting a call at [phone] to discuss if the pt needs to f/u with our office sooner than current appt. Please advise

## 2018-12-22 NOTE — Telephone Encounter (Addendum)
Rn call Christy at [facility] primary care about pt having TIA this morning. RN ask was pt seen at urgent care or hospital this am. Neysa Bonito stated he was just seen at that office today. She stated pt had left hand weakness, slurred speech, could not concentrate, and left eye droop. When he got the office all of his symptoms had resolve. They increase his aspirin to 325mg  and norvasc to 10mg . Rn stated if they order any images. Neysa Bonito stated they did not order any images or scans. They just label it as a TIA based on his symptoms. They wanted to know should pt be seen sooner than his February appt. Rn stated message will be sent to [facility] NP for review.Christy verbalized understanding

## 2018-12-23 NOTE — Telephone Encounter (Signed)
I have reviewed the patient's chart. I saw him in November for a TIA had in September following which he had a complete workup. He needs to be on aspirin and Plavix because of his drug-coated cardiac stents. Even though he may have had a breakthrough TIA I do not believe his treatment strategy is going to change. However the patient needs to be seen sooner by nurse practitioner Shanda Bumps as this may help allay   his and family's anxiety.aggressive risk factor modification needs to be stressed

## 2018-12-23 NOTE — Telephone Encounter (Signed)
Katrina, can you please schedule sooner follow-up appointment for patient.  Thank you.

## 2018-12-23 NOTE — Telephone Encounter (Signed)
Same type of episode that occurred and was diagnosed with TIA in 08/2018.  Dr. Pearlean Brownie, wondering if this could be some type of seizure recurrence, complicated migraine or additional TIA.  He was seen by his primary care office but as all symptoms resolved, imaging was not obtained.  Unsure how you want to proceed.  You did have follow-up appointment with him in 10/2018.

## 2018-12-24 NOTE — Telephone Encounter (Addendum)
I called wife and spoke with pt on three way.I explain that Dr. Pearlean Brownie wants him to be seen sooner than his February appt because of the TIA like symptoms. I stated he needs to continue aspirin and plavix per Dr. Pearlean Brownie. Appt cancel for February 2020, move to December 30, 2018 at 0815. Pt and wife aware of new appt time.

## 2018-12-30 ENCOUNTER — Ambulatory Visit (INDEPENDENT_AMBULATORY_CARE_PROVIDER_SITE_OTHER): Payer: BLUE CROSS/BLUE SHIELD | Admitting: Adult Health

## 2018-12-30 ENCOUNTER — Encounter: Payer: Self-pay | Admitting: Adult Health

## 2018-12-30 ENCOUNTER — Telehealth: Payer: Self-pay | Admitting: Adult Health

## 2018-12-30 VITALS — BP 111/65 | HR 59 | Ht 63.0 in | Wt 146.0 lb

## 2018-12-30 DIAGNOSIS — R413 Other amnesia: Secondary | ICD-10-CM

## 2018-12-30 DIAGNOSIS — G459 Transient cerebral ischemic attack, unspecified: Secondary | ICD-10-CM | POA: Diagnosis not present

## 2018-12-30 DIAGNOSIS — E785 Hyperlipidemia, unspecified: Secondary | ICD-10-CM

## 2018-12-30 DIAGNOSIS — R2990 Unspecified symptoms and signs involving the nervous system: Secondary | ICD-10-CM | POA: Diagnosis not present

## 2018-12-30 NOTE — Progress Notes (Signed)
[facility] [address] [place]. [place] [id number] [phone]       OFFICE CONSULT NOTE  [name] Date of Birth:  01/01/1946 Medical Record Number:  [id number]   Referring MD: Tony BoschAndrea Blevins, NP  Reason for Referral: TIA HPI:  Interval history 12/30/2018: Patient is being seen today for an earlier scheduled appointment after strokelike symptoms on 12/22/2018.  He experienced left hand weakness, slurred speech, unable to concentrate and left eye droop which were similar to the symptoms he experienced previously but when he arrived to his PCP all symptoms had resolved.  He was diagnosed with TIA at his PCP office based on symptoms.  No imaging obtained.  He believes symptoms lasted for a couple hours. He does state that he had increased stress the day prior and feels as though this could have caused his symptoms. He does state that the follow day he felt "drained" with near falls. He feels as though this was due to his balance and would use a cane just in case.  He continues to have intermittent balance difficulties.  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 He is considering retiring as he is increasing in age and feels increased stress at work. He also states that he has been having cognitive slowing since all of this has happened. After calling our office, it was recommended to continue aspirin and Plavix for secondary stroke prevention along with history of drug-coated cardiac stents.  He has continued on both without bleeding or bruising.  He continues on atorvastatin without side effects myalgias.  He has not had repeat lipid panel obtained since initiating atorvastatin.  Blood pressure today 111/65.  He denies any recurrent symptoms since this time.   Initial visit 10/19/2018 PS: Mr. Tony CheadleDalche is a pleasant 73 year old Caucasian male seen today for initial office  consultation visit for TIA.  History is obtained from the patient and review of electronic medical records.  I personally reviewed imaging films in PACS.  He states he woke up on 08/31/2018 at 3 AM which is a usual time for him and noticed that his left arm was weak.  He tried to wake up his wife and had trouble speaking and expressing himself.  Patient also felt that he could not concentrate.  He decided not to go to walk and fell asleep.  When he woke up 2 hours later he noticed he could speak better but his left arm was still little weak but getting better.  They called his primary physician who asked him to go to the emergency room right away.  He was seen by Dr. Laurence SlateAroor neuro hospitalist on-call cardiac the TIA.  MRI scan of the brain was obtained which I personally reviewed showed no acute abnormality and only age-appropriate changes of chronic microvascular disease and mild degree of generalized cerebral atrophy.  MRA of the brain showed no significant intracranial stenosis or occlusion.  Carotid ultrasound showed no significant extracranial stenosis.  Transthoracic echo was unremarkable.  LDL cholesterol was slightly elevated at 96 mg percent.  Hemoglobin A1c was 5.8.  Patient was on aspirin and Plavix because of history of drug-coated cardiac stents which was continued.  He was on pravastatin 40 mg which was changed to Lipitor 80 mg daily.  Patient states that he did have a history of Lipitor related myalgias in the past and hence had  been switched to pravastatin.   Marland Kitchen  He still working about 8 hours/day but complains of increased fatigue recently.  He is had no significant bleeding but bruises with aspirin and Plavix.  He has no new complaints today.  He denies any prior history of strokes TIAs seizures or other significant neurological problems.  He has a history of chronic tinnitus and vertigo which is stable      ROS:   14 system review of systems is positive for apnea and all other systems  negative  PMH:  Past Medical History:  Diagnosis Date  . Angina   . Coronary artery disease   . GERD (gastroesophageal reflux disease)   . Hypertension     Social History:  Social History   Socioeconomic History  . Marital status: Married    Spouse name: Not on file  . Number of children: Not on file  . Years of education: Not on file  . Highest education level: Not on file  Occupational History  . Not on file  Social Needs  . Financial resource strain: Not on file  . Food insecurity:    Worry: Not on file    Inability: Not on file  . Transportation needs:    Medical: Not on file    Non-medical: Not on file  Tobacco Use  . Smoking status: Never Smoker  . Smokeless tobacco: Never Used  Substance and Sexual Activity  . Alcohol use: No  . Drug use: No  . Sexual activity: Not Currently  Lifestyle  . Physical activity:    Days per week: Not on file    Minutes per session: Not on file  . Stress: Not on file  Relationships  . Social connections:    Talks on phone: Not on file    Gets together: Not on file    Attends religious service: Not on file    Active member of club or organization: Not on file    Attends meetings of clubs or organizations: Not on file    Relationship status: Not on file  . Intimate partner violence:    Fear of current or ex partner: Not on file    Emotionally abused: Not on file    Physically abused: Not on file    Forced sexual activity: Not on file  Other Topics Concern  . Not on file  Social History Narrative  . Not on file    Medications:   Current Outpatient Medications on File Prior to Visit  Medication Sig Dispense Refill  . acetaminophen (TYLENOL) 650 MG CR tablet Take 650 mg by mouth 3 (three) times daily. For arthritis pain    . amLODipine (NORVASC) 5 MG tablet Take 10 mg by mouth daily.   2  . aspirin EC 81 MG tablet Take 325 mg by mouth at bedtime.     Marland Kitchen atorvastatin (LIPITOR) 80 MG tablet Take 1 tablet (80 mg total) by  mouth daily at 6 PM. 30 tablet 0  . clopidogrel (PLAVIX) 75 MG tablet Take 75 mg by mouth daily.    . Coenzyme Q10 200 MG capsule Take 1 capsule (200 mg total) by mouth daily. 30 capsule 1  . doxazosin (CARDURA) 1 MG tablet Take 1 mg by mouth at bedtime.     Marland Kitchen EPINEPHrine (EPIPEN 2-PAK) 0.3 mg/0.3 mL IJ SOAJ injection Inject 0.3 mLs (0.3 mg total) into the muscle once as needed (for severe allergic reaction). CAll 911 immediately if you have to use this medicine  1 Device 1  . metoprolol succinate (TOPROL-XL) 50 MG 24 hr tablet Take 1 tablet (50 mg total) by mouth daily. Patient takes at lunchtime Take with or immediately following a meal. (Patient taking differently: Take 25 mg by mouth daily. Patient takes at lunchtime Take with or immediately following a meal.) 3 tablet 30  . Multiple Vitamin (MULTIVITAMIN WITH MINERALS) TABS tablet Take 1 tablet by mouth daily.    . nitroGLYCERIN (NITROSTAT) 0.4 MG SL tablet Place 0.4 mg under the tongue every 5 (five) minutes as needed. For chest pain    . Omega-3 Fatty Acids (FISH OIL PO) Take 1,200 mg by mouth daily. Patient takes 1 in the morning, 1 at lunchtime, 2 with dinner     . ranitidine (ZANTAC) 150 MG tablet Take 150 mg by mouth 2 (two) times daily as needed for heartburn.    . sertraline (ZOLOFT) 25 MG tablet Take 25 mg by mouth daily.  2  . St Johns Wort ([employer] [facility] JOHNS WORT) 150 MG CAPS Take 300 mg by mouth every morning.     No current facility-administered medications on file prior to visit.     Allergies:   Allergies  Allergen Reactions  . Other Other (See Comments)    Febreze(lavender) causes itching     Physical Exam General: frail elderly caucasian male, seated, in no evident distress but mild anxiety Head: head normocephalic and atraumatic.   Neck: supple with no carotid or supraclavicular bruits Cardiovascular: regular rate and rhythm, no murmurs Musculoskeletal: mild kyphoscoliosis Skin:  no rash/petichiae Vascular:  Normal  pulses all extremities  Neurologic Exam Mental Status: Awake and fully alert. Oriented to place and time. Recent and remote memory intact. Attention span, concentration and fund of knowledge appropriate. Mood and affect appropriate.  Cranial Nerves: Fundoscopic exam reveals sharp disc margins. Pupils equal, briskly reactive to light. Extraocular movements full without nystagmus. Visual fields full to confrontation. Hearing intact. Facial sensation intact. Face, tongue, palate moves normally and symmetrically.  Motor: Normal bulk and tone. Normal strength in all tested extremity muscles. Sensory.: intact to touch , pinprick , position and vibratory sensation.  Coordination: Rapid alternating movements normal in all extremities. Finger-to-nose and heel-to-shin performed accurately bilaterally. Gait and Station: Arises from chair without difficulty. Stance is normal. Gait demonstrates normal stride length and balance . Able to heel, toe and tandem walk with t difficulty.  Reflexes: 1+ and symmetric. Toes downgoing.   NIHSS 0 Modified Rankin  0   ASSESSMENT: [name] is a [age] year patient with episode of transient speech difficulties and left arm weakness likely a right hemispheric TIA in September 2019 etiology possibly small vessel disease.  Recurrent episode of similar symptoms on 12/22/2018 diagnosed with TIA at PCP office.  Vascular risk factors of hypertension, hyperlipidemia, cardiac stents and age.  Patient is being seen today for follow-up visit after possible TIA on 12/22/2018 without any recurrent symptoms but does continue to complain of cognitive slowing and balance difficulties.     PLAN: 1. TIA/strokelike symptoms: Continue aspirin 81 mg daily and clopidogrel 75 mg daily  and atorvastatin 80 mg for secondary stroke prevention.  Obtain CT head to ensure no acute infarct.  Can consider EEG in the future if needed.  Maintain strict control of hypertension with blood pressure goal below  130/90, diabetes with hemoglobin A1c goal below 6.5% and cholesterol with LDL cholesterol (bad cholesterol) goal below 70 mg/dL.  I also advised the patient to eat a healthy diet with  plenty of whole grains, cereals, fruits and vegetables, exercise regularly with at least 30 minutes of continuous activity daily and maintain ideal body weight.  Work note provided for patient to return as he feels as though he would like to try to go back to work at this time but will notify us if he has any difficulties 2. Cognitive deficit: Recommended mind exercises such as sukuo, word search, crossword, reading and card games.  We will also obtain B12 level and can consider doing EEG in the future if needed.  Also advised him that this could be due to increased stress and to perform stress relaxation techniques 3. HTN: Advised to continue current treatment regimen.  Today's BP 111/65.  Advised patient to start to monitor BP at home to ensure balance difficulties are not due to low blood pressure.  He will continue to follow with cardiologist for HTN management 4. HLD: Advised to continue current treatment regimen.  Obtain lipid panel at today's appointment to ensure satisfactory levels with current statin  Follow-up in 3 months or call earlier if needed    Greater than 50% time during this 45-minute consultation visit was spent on counseling and coordination of care about TIA and discussion about stroke prevention and treatment and answering questions.  George Hugh, AGNP-BC  [facility] [address] [place], [place] [address]  Phone [phone] Fax [phone] Note: This document was prepared with digital dictation and possible smart phrase technology. Any transcriptional errors that result from this process are unintentional.

## 2018-12-30 NOTE — Telephone Encounter (Signed)
BCBS/Medicare [employer] Harvey: [id number] (exp. [id number] (exp. 12/30/18 to 01/28/19) pt is scheduled at GI for 01/01/19

## 2018-12-30 NOTE — Patient Instructions (Signed)
Continue aspirin 81 mg daily and clopidogrel 75 mg daily  and lipitor  for secondary stroke prevention  Continue to follow up with PCP regarding cholesterol and blood pressure management   We will check a CT scan to ensure you have not had a new stroke  We will also check blood work today - we will call you tomorrow with the results  Continue to monitor blood pressure at home  Maintain strict control of hypertension with blood pressure goal below 130/90, diabetes with hemoglobin A1c goal below 6.5% and cholesterol with LDL cholesterol (bad cholesterol) goal below 70 mg/dL. I also advised the patient to eat a healthy diet with plenty of whole grains, cereals, fruits and vegetables, exercise regularly and maintain ideal body weight.  Followup in the future with me in 3 months or call earlier if needed       Thank you for coming to see [place] at [facility]. I hope we have been able to provide you high quality care today.  You may receive a patient satisfaction survey over the next few weeks. We would appreciate your feedback and comments so that we may continue to improve ourselves and the health of our patients.

## 2018-12-31 ENCOUNTER — Telehealth: Payer: Self-pay

## 2018-12-31 LAB — LIPID PANEL
CHOL/HDL RATIO: 2.7 ratio (ref 0.0–5.0)
Cholesterol, Total: 136 mg/dL (ref 100–199)
HDL: 51 mg/dL (ref >39)
LDL Calculated: 67 mg/dL (ref 0–99)
Triglycerides: 91 mg/dL (ref 0–149)
VLDL Cholesterol Cal: 18 mg/dL (ref 5–40)

## 2018-12-31 LAB — VITAMIN B12: VITAMIN B 12: 1068 pg/mL (ref 232–1245)

## 2018-12-31 NOTE — Telephone Encounter (Signed)
I called patient that his cholesterol panel and vitamin b 12. Its satisfactory and continue regimen. PT verbalized understanding.

## 2018-12-31 NOTE — Progress Notes (Signed)
I agree with the above plan 

## 2018-12-31 NOTE — Telephone Encounter (Signed)
-----   Message from George Hugh, NP sent at 12/31/2018  6:26 AM EST ----- Please notify patient that his recent cholesterol panel and vitamin B12 levels were satisfactory and recommend continue current regimen.

## 2019-01-01 ENCOUNTER — Ambulatory Visit
Admission: RE | Admit: 2019-01-01 | Discharge: 2019-01-01 | Disposition: A | Discharge status: Home / Self Care | Payer: BLUE CROSS/BLUE SHIELD | Source: Ambulatory Visit | Attending: Adult Health | Admitting: Adult Health

## 2019-01-01 DIAGNOSIS — G459 Transient cerebral ischemic attack, unspecified: Secondary | ICD-10-CM

## 2019-01-04 ENCOUNTER — Telehealth: Payer: Self-pay

## 2019-01-04 NOTE — Telephone Encounter (Signed)
-----   Message from George Hugh, NP sent at 01/04/2019  6:43 AM EST ----- Please advise patient that his recent CT had did not show evidence of stroke or concern.

## 2019-01-04 NOTE — Telephone Encounter (Signed)
Spoke with patient's wife( DPR verified) due to the patient being at work. Mrs. Tony CheadleDalche wrote down CT Head results so that she could tell her husband once he returns home from work. I advised her that if they have any further questions to give us a call. Office number was provided.

## 2019-01-20 ENCOUNTER — Ambulatory Visit: Payer: BLUE CROSS/BLUE SHIELD | Admitting: Adult Health

## 2019-03-23 ENCOUNTER — Telehealth: Payer: Self-pay | Admitting: Adult Health

## 2019-03-23 NOTE — Telephone Encounter (Signed)
Due to current COVID 19 pandemic, our office is severely reducing in office visits for at least the next 2 weeks, in order to minimize the risk to our patients and healthcare providers. Pt understands that although there may be some limitations with this type of visit, we will take all precautions to reduce any security or privacy concerns.  Pt understands that this will be treated like an in office visit and we will file with pt's insurance, and there may be a patient responsible charge related to this service. Best contact [phone]

## 2019-04-01 ENCOUNTER — Encounter: Payer: Self-pay | Admitting: Family Medicine

## 2019-04-01 ENCOUNTER — Ambulatory Visit (INDEPENDENT_AMBULATORY_CARE_PROVIDER_SITE_OTHER): Payer: Medicare Other | Admitting: Family Medicine

## 2019-04-01 ENCOUNTER — Other Ambulatory Visit: Payer: Self-pay

## 2019-04-01 DIAGNOSIS — G459 Transient cerebral ischemic attack, unspecified: Secondary | ICD-10-CM | POA: Diagnosis not present

## 2019-04-01 NOTE — Progress Notes (Signed)
PATIENT: Tony LintsCharles D Wheeler DOB: 07/26/1946  REASON FOR VISIT: follow up HISTORY FROM: patient  Virtual Visit via Telephone Note  I connected with Tony Lintsharles D Wheeler on 04/01/19 at  8:45 AM EDT by telephone and verified that I am speaking with the correct person using two identifiers.   I discussed the limitations, risks, security and privacy concerns of performing an evaluation and management service by telephone and the availability of in person appointments. I also discussed with the patient that there may be a patient responsible charge related to this service. The patient expressed understanding and agreed to proceed.   History of Present Illness:  04/01/19 [name] is a 73 y.o. male for follow up for concerns of TIA. He continues Plavix 75mg  and aspirin 81mg  daily. He is also taking Lipitor 80mg  daily.  He reports doing fairly well.  He is keeping an eye on his blood pressures and reports that they are well controlled.  He continues regular follow-up with his primary care provider.  He denies any additional concerns of altered awareness or TIA symptoms.  Labs were reviewed at last visit with Shanda BumpsJessica and cholesterol levels were acceptable.  History (copied from Shanda BumpsJessica Vanschaick's note on 12/30/2018)  Interval history 12/30/2018: Patient is being seen today for an earlier scheduled appointment after strokelike symptoms on 12/22/2018.  He experienced left hand weakness, slurred speech, unable to concentrate and left eye droop which were similar to the symptoms he experienced previously but when he arrived to his PCP all symptoms had resolved.  He was diagnosed with TIA at his PCP office based on symptoms.  No imaging obtained.  He believes symptoms lasted for a couple hours. He does state that he had increased stress the day prior and feels as though this could have caused his symptoms. He does state that the follow day he felt "drained" with near falls. He feels as though this was due to  his balance and would use a cane just in case.  He continues to have intermittent balance difficulties.  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 He is considering retiring as he is increasing in age and feels increased stress at work. He also states that he has been having cognitive slowing since all of this has happened. After calling our office, it was recommended to continue aspirin and Plavix for secondary stroke prevention along with history of drug-coated cardiac stents.  He has continued on both without bleeding or bruising.  He continues on atorvastatin without side effects myalgias.  He has not had repeat lipid panel obtained since initiating atorvastatin.  Blood pressure today 111/65.  He denies any recurrent symptoms since this time.   Initial visit 10/19/2018 PS: Tony. Delman CheadleDalche is a pleasant 73 year old Caucasian male seen today for initial office consultation visit for TIA.  History is obtained from the patient and review of electronic medical records.  I personally reviewed imaging films in PACS.  He states he woke up on 08/31/2018 at 3 AM which is a usual time for him and noticed that his left arm was weak.  He tried to wake up his wife and had trouble speaking and expressing himself.  Patient also felt that he could not concentrate.  He decided not to go to walk and fell asleep.  When he woke up 2 hours later he noticed he could speak better but  his left arm was still little weak but getting better.  They called his primary physician who asked him to go to the emergency room right away.  He was seen by Dr. Laurence Slate neuro hospitalist on-call cardiac the TIA.  MRI scan of the brain was obtained which I personally reviewed showed no acute abnormality and only age-appropriate changes of chronic microvascular disease and mild degree of generalized cerebral atrophy.  MRA of the brain showed no significant  intracranial stenosis or occlusion.  Carotid ultrasound showed no significant extracranial stenosis.  Transthoracic echo was unremarkable.  LDL cholesterol was slightly elevated at 96 mg percent.  Hemoglobin A1c was 5.8.  Patient was on aspirin and Plavix because of history of drug-coated cardiac stents which was continued.  He was on pravastatin 40 mg which was changed to Lipitor 80 mg daily.  Patient states that he did have a history of Lipitor related myalgias in the past and hence had been switched to pravastatin.   Marland Kitchen  He still working about 8 hours/day but complains of increased fatigue recently.  He is had no significant bleeding but bruises with aspirin and Plavix.  He has no new complaints today.  He denies any prior history of strokes TIAs seizures or other significant neurological problems.  He has a history of chronic tinnitus and vertigo which is stable     Observations/Objective:  Generalized: Well developed, in no acute distress  Mentation: Alert oriented to time, place, history taking. Follows all commands speech and language fluent   Assessment and Plan:  73 y.o. year old male  has a past medical history of Angina, Coronary artery disease, GERD (gastroesophageal reflux disease), and Hypertension. with    ICD-10-CM   1. TIA (transient ischemic attack) G45.9    [name] is doing well.  He continues close follow-up with his primary care provider.  He is tolerating aspirin, Plavix and Lipitor as prescribed.  Blood pressures and cholesterol are well managed.  He has had no additional concerns of TIA symptoms.  He verbalizes understanding of risk factors and symptoms that would require urgent assessment.  He was advised to dial 911 with any concerns of acute onset of weakness, trouble speaking, facial changes, or confusion.  He verbalizes understanding.  We will follow-up in the office in 6 months.  At that time if he continues to do well we have discussed releasing him back to PCP.   He verbalizes understanding and agreement with this plan.   No orders of the defined types were placed in this encounter.   No orders of the defined types were placed in this encounter.    Follow Up Instructions:  I discussed the assessment and treatment plan with the patient. The patient was provided an opportunity to ask questions and all were answered. The patient agreed with the plan and demonstrated an understanding of the instructions.   The patient was advised to call back or seek an in-person evaluation if the symptoms worsen or if the condition fails to improve as anticipated.  I provided 25 minutes of non-face-to-face time during this encounter.  Patient is located at his place of residence during conference.  Provider located at her place of residence.  Alverda Skeans, RN help to facilitate visit.   Shawnie Dapper, NP

## 2019-04-01 NOTE — Progress Notes (Signed)
I agree with the above plan 

## 2019-08-23 IMAGING — CT CT HEAD W/O CM
4 series · 17 of 47 positions shown, 19 images · non-contrast
Comparison: None.

CLINICAL DATA: Left-sided facial droop and slurred speech for
several hours

EXAM:
CT HEAD WITHOUT CONTRAST
TECHNIQUE: Contiguous axial images were obtained from the base of the skull
through the vertex without intravenous contrast.

[Series 3: head wo · axial · 0.42mm/px · z∈[+1412,+1526]mm · 7 of 31 slices shown, 9 images]
[im 4/31  brain]
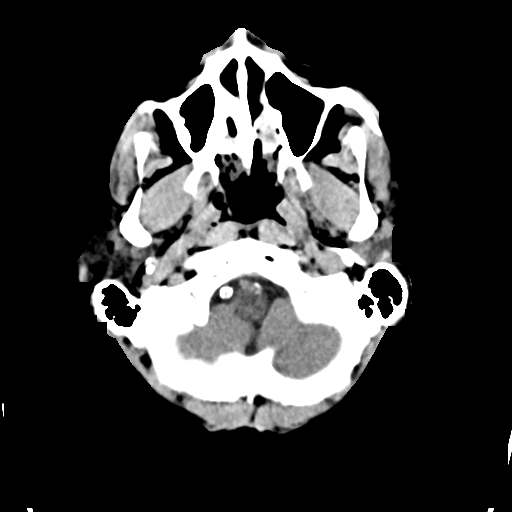
[im 4/31  bone]
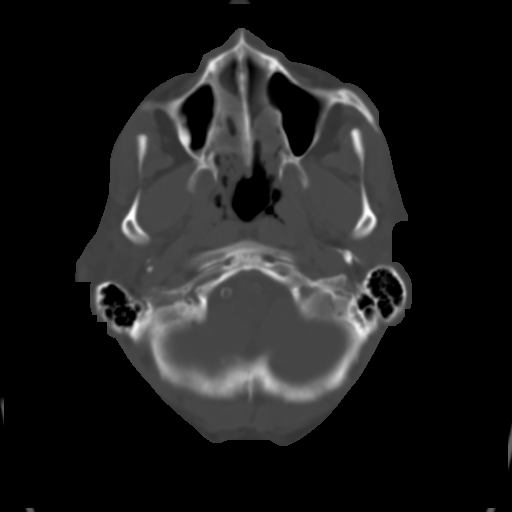
[im 8/31  brain]
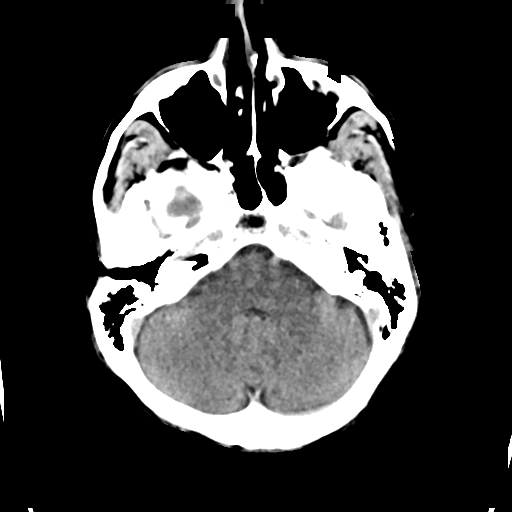
[im 12/31  brain]
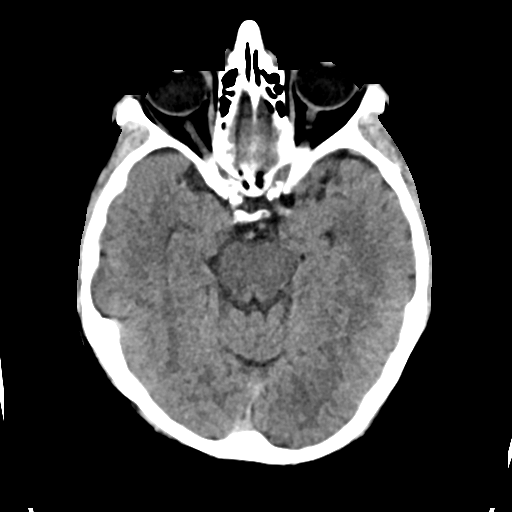
[im 16/31  brain]
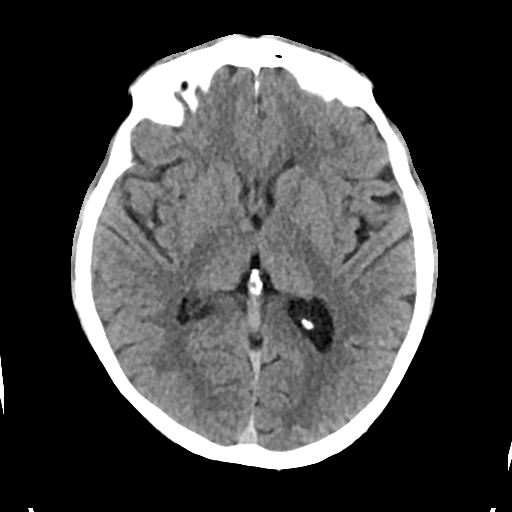
[im 19/31  brain]
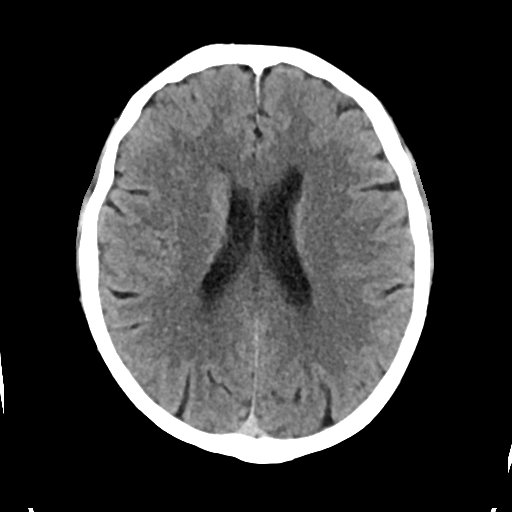
[im 19/31  bone]
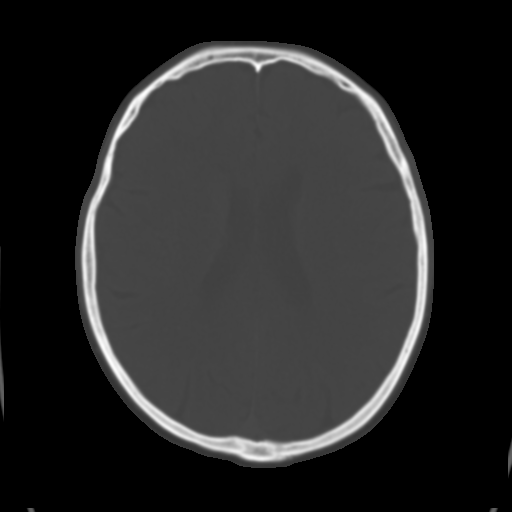
[im 23/31  brain]
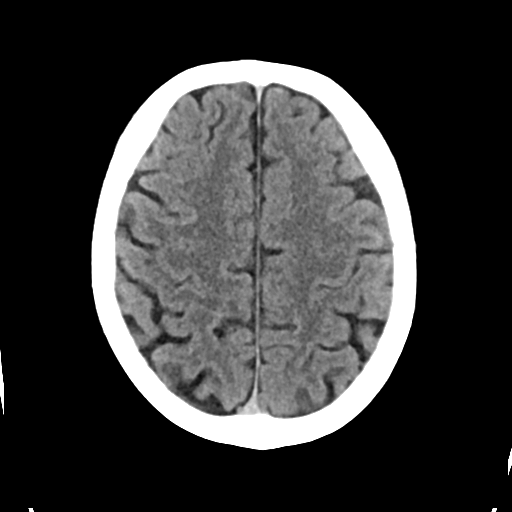
[im 27/31  brain]
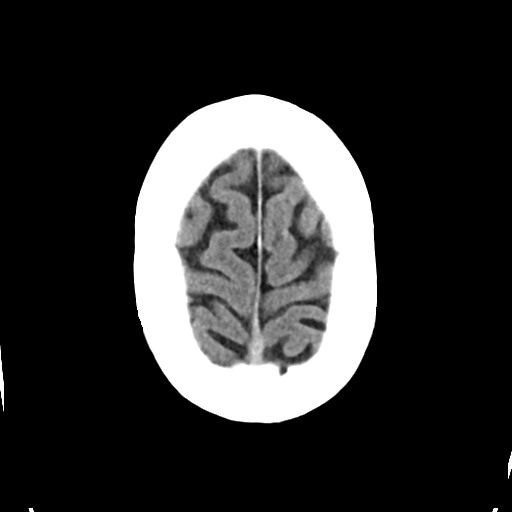

[Series 4: head bone · axial · 0.42mm/px · z∈[+1410,+1464]mm · 4 of 77 slices shown]
[im 8/77  bone]
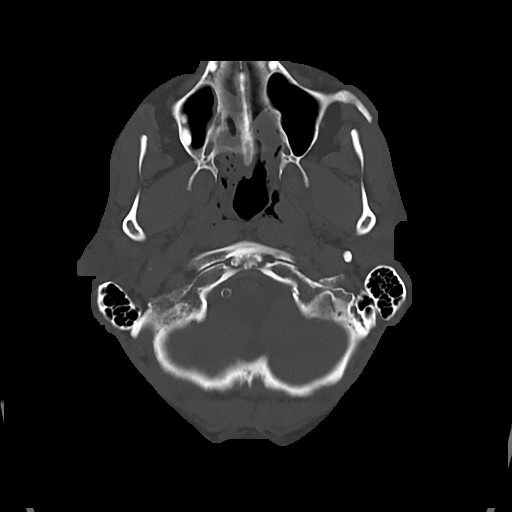
[im 16/77  bone]
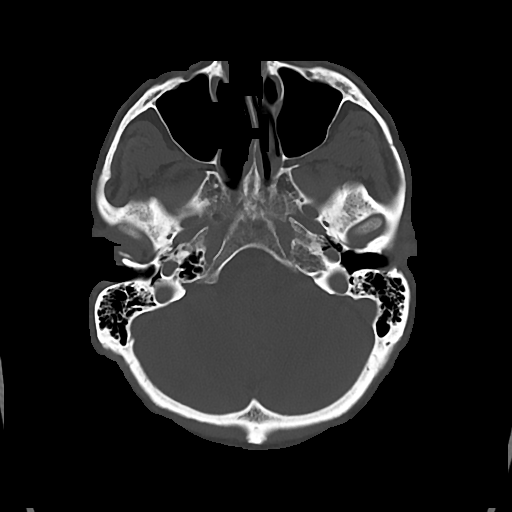
[im 23/77  bone]
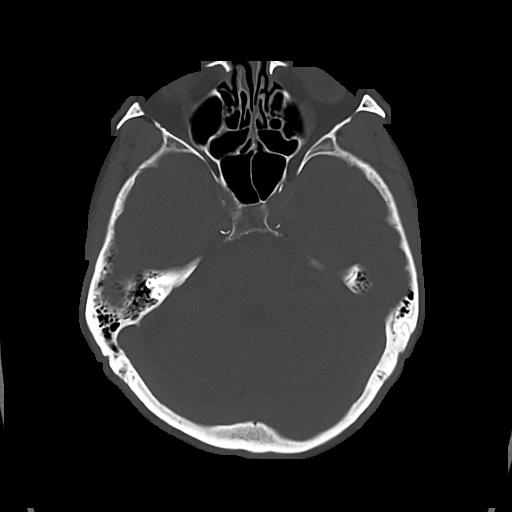
[im 35/77  bone]
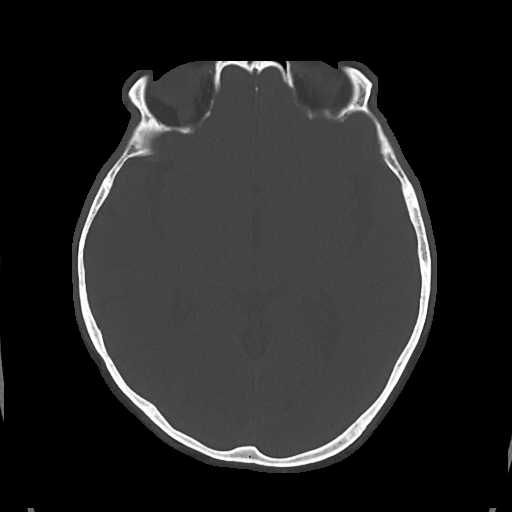

[Series 5: cor soft · coronal · 0.34mm/px · 3 of 63 slices shown]
[im 21/63  brain]
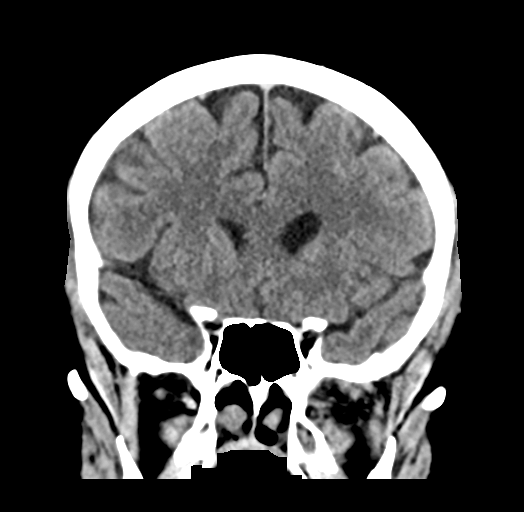
[im 28/63  brain]
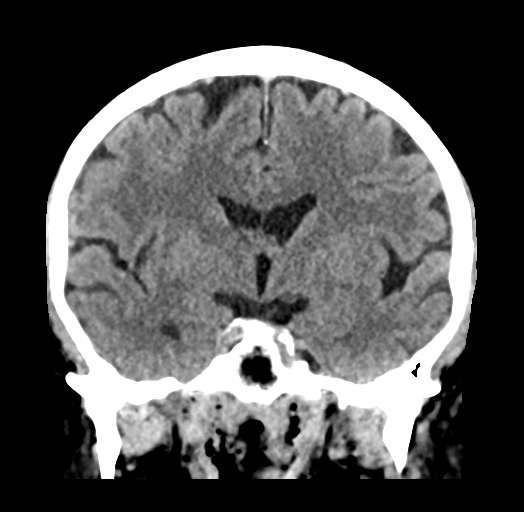
[im 35/63  brain]
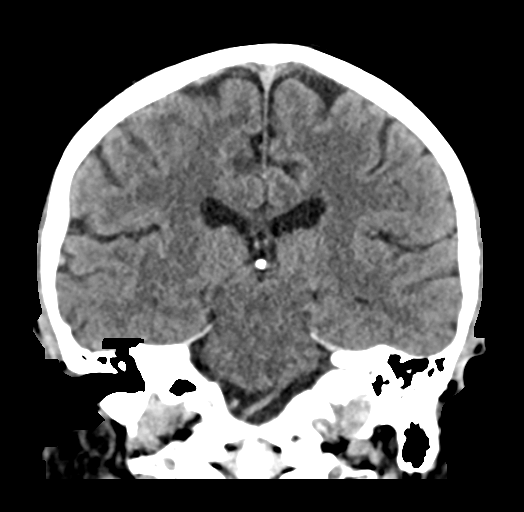

[Series 6: sag soft · sagittal · 0.33mm/px · 3 of 54 slices shown]
[im 18/54  brain]
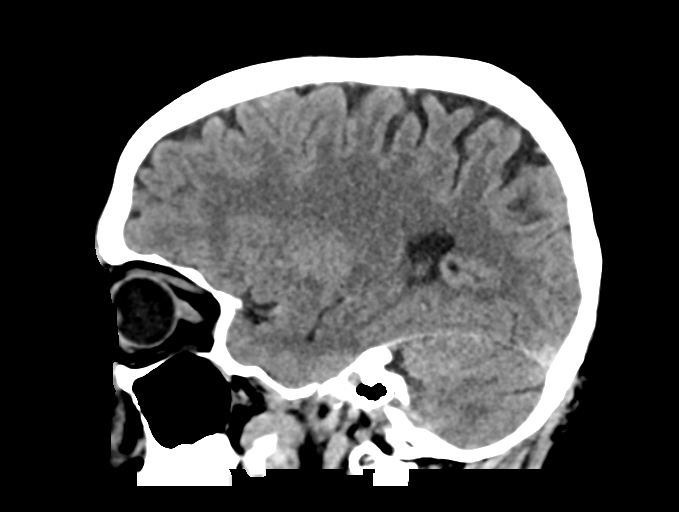
[im 27/54  brain]
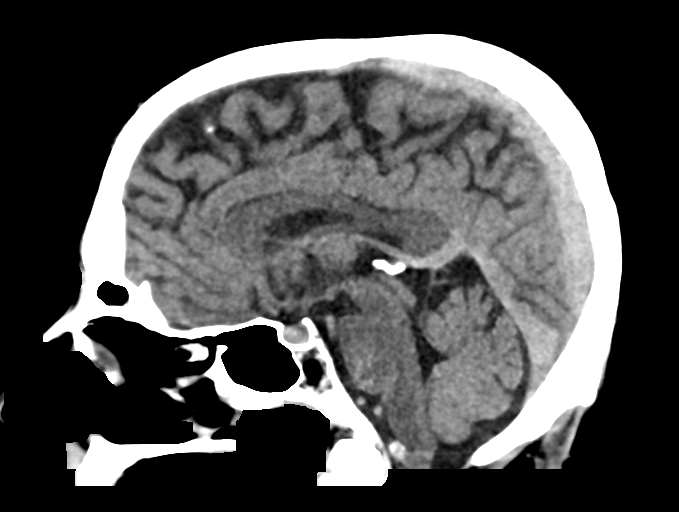
[im 36/54  brain]
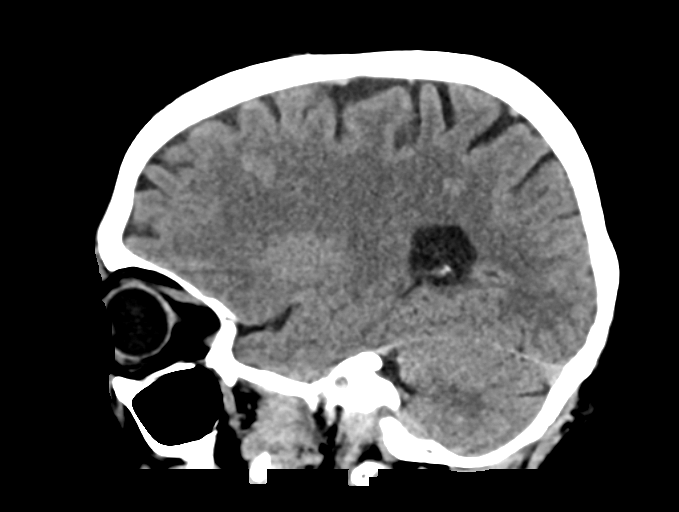

[17 of 47 positions shown; findings below may reference images not displayed]

FINDINGS: Brain: No evidence of acute infarction, hemorrhage, hydrocephalus,
extra-axial collection or mass lesion/mass effect.

Vascular: No hyperdense vessel or unexpected calcification.

Skull: Normal. Negative for fracture or focal lesion.

Sinuses/Orbits: No acute finding.

Other: None.
IMPRESSION: Normal head CT

## 2019-08-23 IMAGING — MR MR HEAD W/O CM
9 of 10 series · 35 of 48 positions shown · non-contrast
Comparison: 08/31/2018 CT head.  10/03/2012 MRI head.

CLINICAL DATA: 71 y/o M; TIA, initial exam. Episode of slurred
speech and left arm numbness lasting approximately 2-3 hours.

EXAM:
MRI HEAD WITHOUT CONTRAST
TECHNIQUE: Multiplanar, multiecho pulse sequences of the brain and surrounding
structures were obtained without intravenous contrast.

[Series 3: DWI · axial · 3.0mm · 0.94mm/px · z∈[-45,+91]mm · 8 of 94 slices shown (1 of 2)]
[im 1/94]
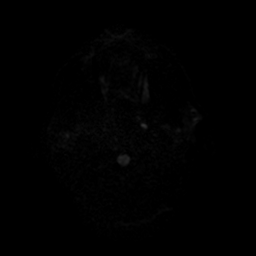
[im 11/94]
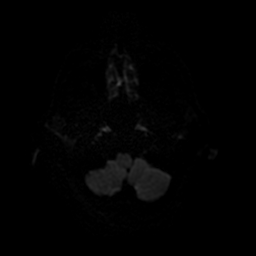
[im 32/94]
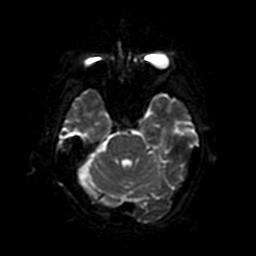
[im 42/94]
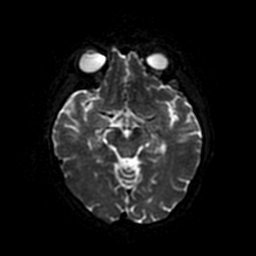
[im 52/94]
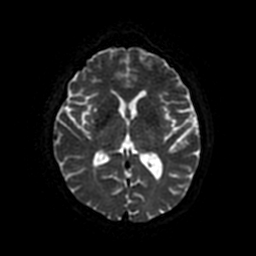
[im 63/94]
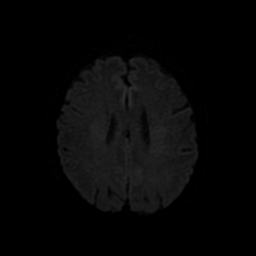
[im 83/94]
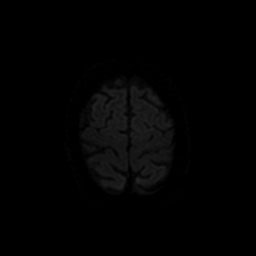
[im 94/94]
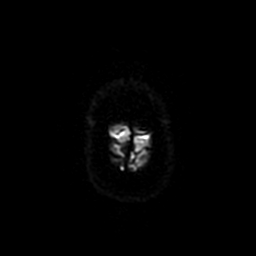

[Series 5: T2 · axial · 5.0mm · 0.47mm/px · z∈[-45,+91]mm · 2 of 24 slices shown (1 of 2)]
[im 1/24]
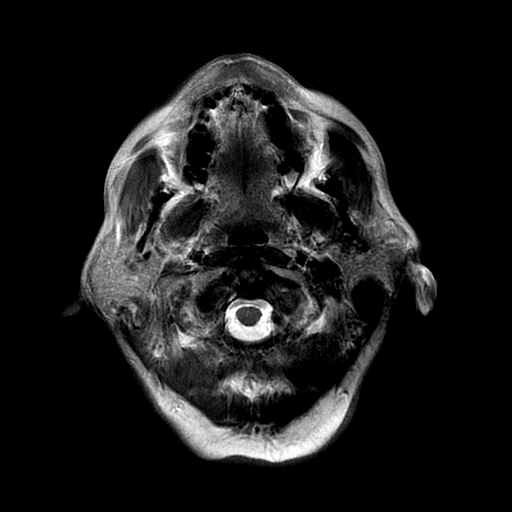
[im 24/24]
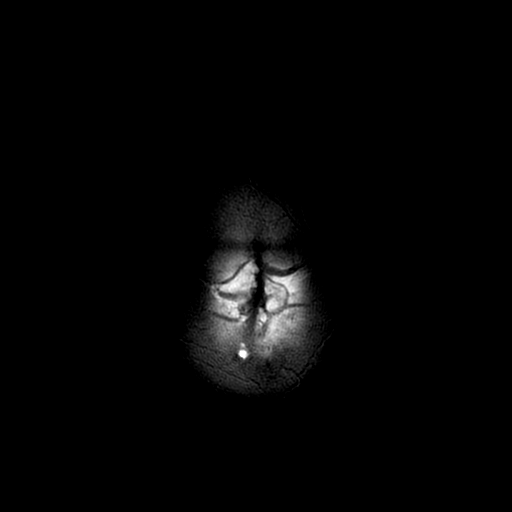

[Series 6: FLAIR · axial · 3.0mm · 0.47mm/px · z∈[-45,+91]mm · 2 of 24 slices shown (1 of 2)]
[im 1/24]
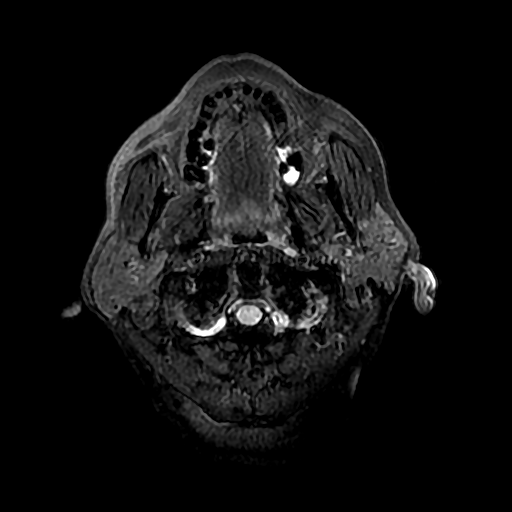
[im 24/24]
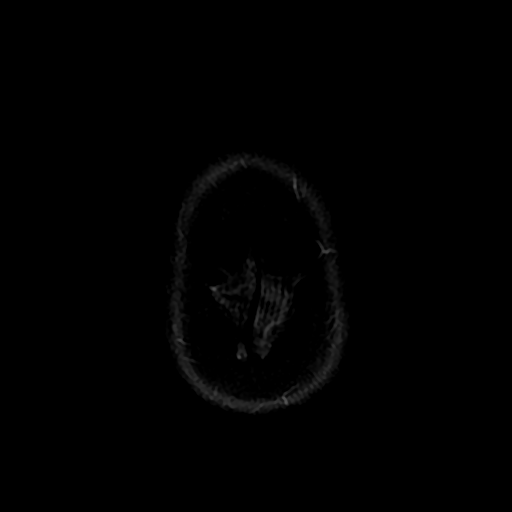

[Series 7: (person_name) · axial · 3.0mm · 0.47mm/px · z∈[-47,+15]mm · 4 of 96 slices shown]
[im 1/96]
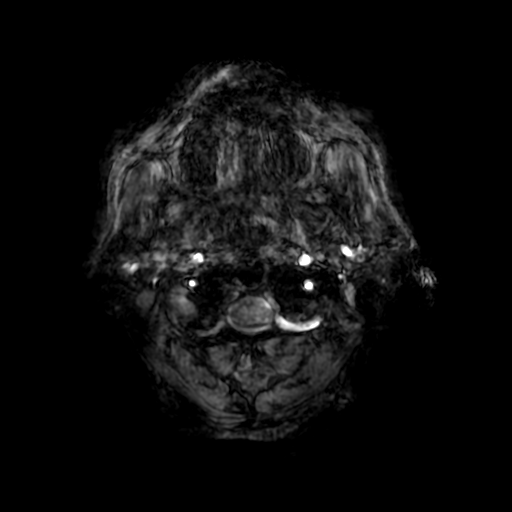
[im 11/96]
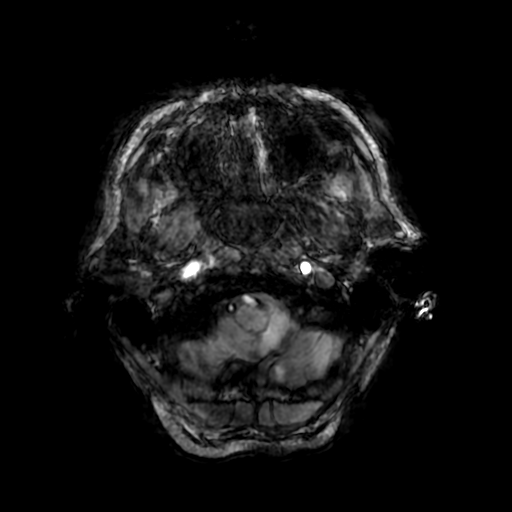
[im 32/96]
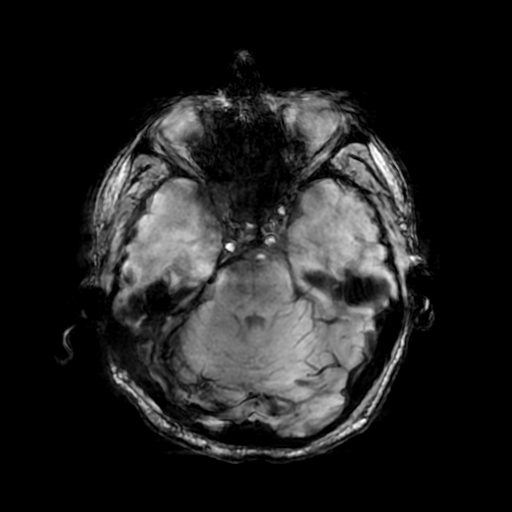
[im 43/96]
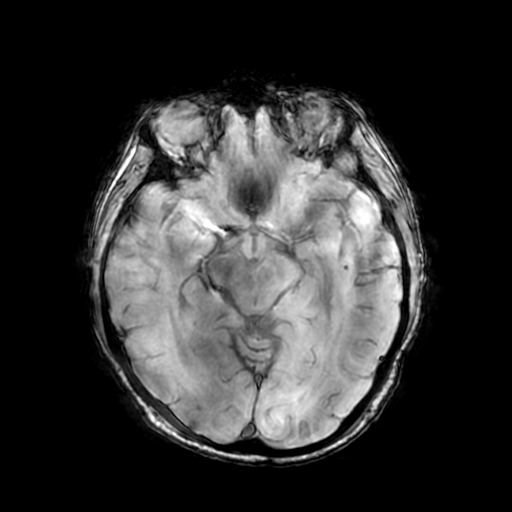

[Series 8: DWI · coronal · 4.0mm · 0.94mm/px · 7 of 67 slices shown (2 of 2)]
[im 1/67]
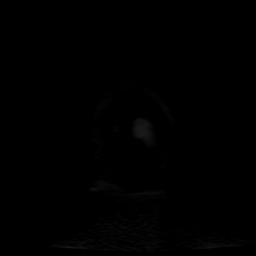
[im 12/67]
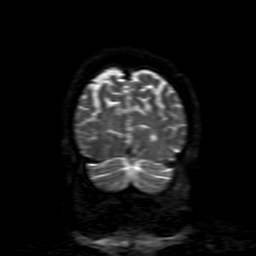
[im 23/67]
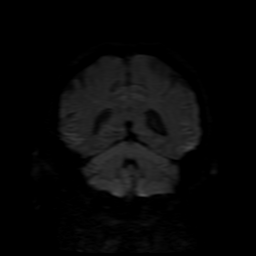
[im 34/67]
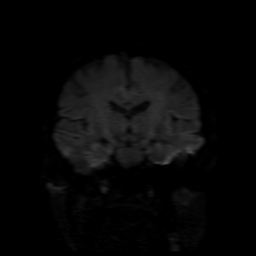
[im 45/67]
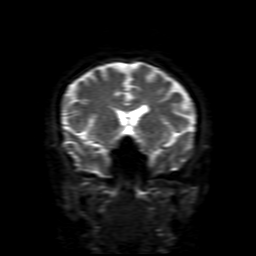
[im 56/67]
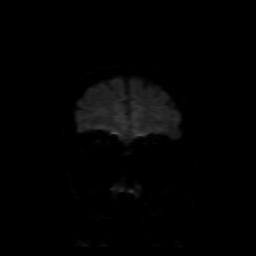
[im 67/67]
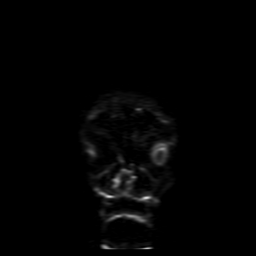

[Series 9: FLAIR · sagittal · 5.0mm · 0.47mm/px · 2 of 25 slices shown (2 of 2)]
[im 1/25]
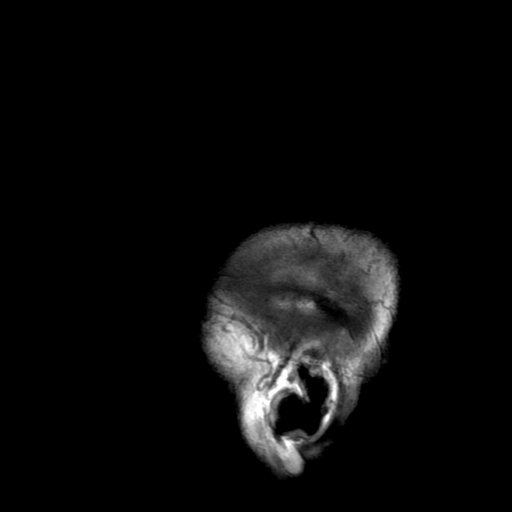
[im 25/25]
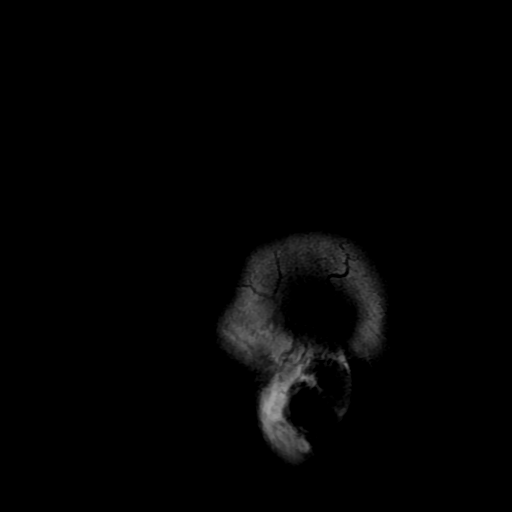

[Series 11: T2 · coronal · 5.0mm · 0.43mm/px · 3 of 28 slices shown (2 of 2)]
[im 1/28]
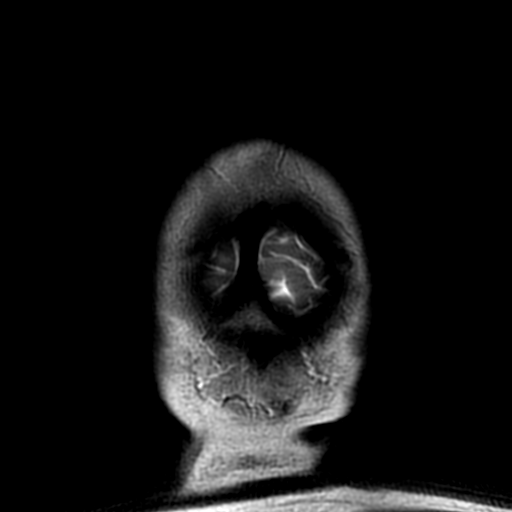
[im 14/28]
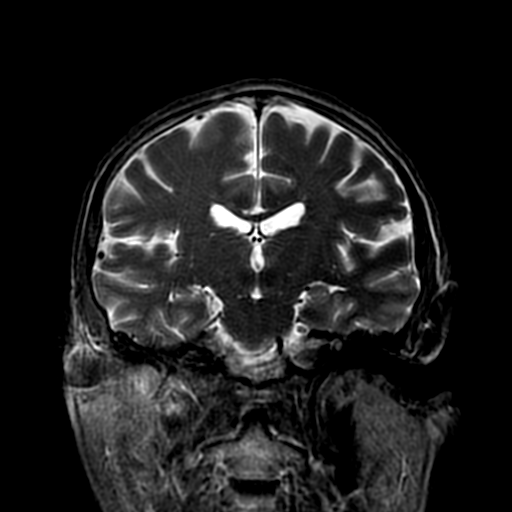
[im 28/28]
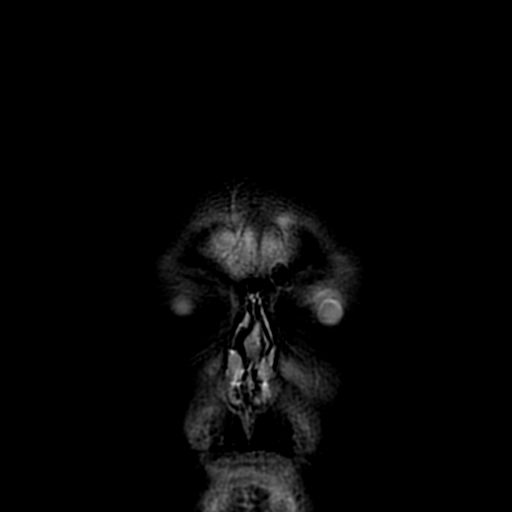

[Series 350: ADC · axial · 3.0mm · 0.94mm/px · z∈[-45,+91]mm · 4 of 45 slices shown (1 of 2)]
[im 1/45]
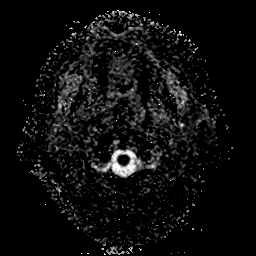
[im 15/45]
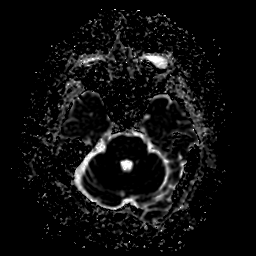
[im 30/45]
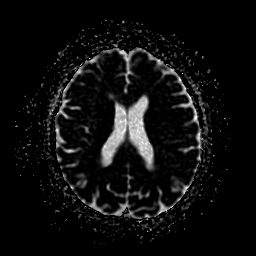
[im 45/45]
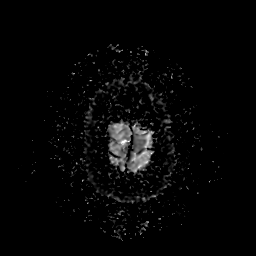

[Series 850: ADC · coronal · 4.0mm · 0.94mm/px · 3 of 34 slices shown (2 of 2)]
[im 1/34]
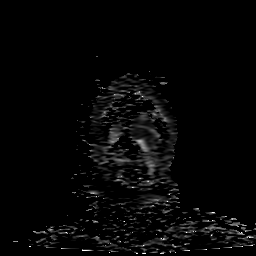
[im 17/34]
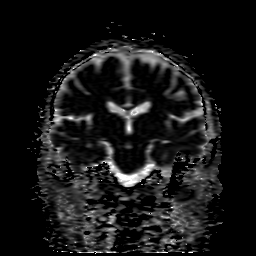
[im 34/34]
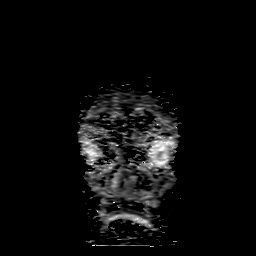

[35 of 48 positions shown; findings below may reference images not displayed]

FINDINGS: Brain: No acute infarction, hemorrhage, hydrocephalus, extra-axial
collection or mass lesion. Few stable nonspecific T2 FLAIR
hyperintensities in subcortical and periventricular white matter are
compatible with mild chronic microvascular ischemic changes for age.
Mild stable volume loss of the brain.

Vascular: Normal flow voids.

Skull and upper cervical spine: Normal marrow signal.

Sinuses/Orbits: Negative.

Other: None.
IMPRESSION: 1. No acute intracranial abnormality.
2. Stable mild chronic microvascular ischemic changes and volume
loss of the head.

By: Ny Baumann M.D.

## 2019-10-05 ENCOUNTER — Ambulatory Visit: Payer: Medicare Other | Admitting: Adult Health

## 2020-02-10 ENCOUNTER — Ambulatory Visit: Payer: Medicare Other | Attending: Internal Medicine

## 2020-02-10 DIAGNOSIS — Z23 Encounter for immunization: Secondary | ICD-10-CM | POA: Insufficient documentation

## 2020-02-10 NOTE — Progress Notes (Signed)
   Covid-19 Vaccination Clinic  Name:  [name]    MRN: [id number] DOB: June 18, 1946  02/10/2020  Mr. [name] was observed post Covid-19 immunization for 15 minutes without incidence. He was provided with Vaccine Information Sheet and instruction to access the V-Safe system.   Mr. [name] was instructed to call 911 with any severe reactions post vaccine: Marland Kitchen Difficulty breathing  . Swelling of your face and throat  . A fast heartbeat  . A bad rash all over your body  . Dizziness and weakness    Immunizations Administered    Name Date Dose VIS Date Route   Pfizer COVID-19 Vaccine 02/10/2020 10:50 AM 0.3 mL 11/26/2019 Intramuscular   Manufacturer: [employer], [employer]   Lot: [id number]   NDC: [address]-5

## 2020-03-08 ENCOUNTER — Ambulatory Visit: Payer: Medicare Other | Attending: Internal Medicine

## 2020-03-08 DIAGNOSIS — Z23 Encounter for immunization: Secondary | ICD-10-CM

## 2020-03-08 NOTE — Progress Notes (Signed)
   Covid-19 Vaccination Clinic  Name:  [name]    MRN: [id number] DOB: Jul 02, 1946  03/08/2020  [name] was observed post Covid-19 immunization for 15 minutes without incident. He was provided with Vaccine Information Sheet and instruction to access the V-Safe system.   [name] was instructed to call 911 with any severe reactions post vaccine: Marland Kitchen Difficulty breathing  . Swelling of face and throat  . A fast heartbeat  . A bad rash all over body  . Dizziness and weakness   Immunizations Administered    Name Date Dose VIS Date Route   Pfizer COVID-19 Vaccine 03/08/2020 10:30 AM 0.3 mL 11/26/2019 Intramuscular   Manufacturer: [employer], [employer]   Lot: [id number]   NDC: [address]-3

## 2023-04-09 ENCOUNTER — Emergency Department (HOSPITAL_COMMUNITY): Payer: Medicare Other

## 2023-04-09 ENCOUNTER — Emergency Department (HOSPITAL_COMMUNITY)
Admission: EM | Admit: 2023-04-09 | Discharge: 2023-04-09 | Disposition: A | Discharge status: Home / Self Care | Payer: Medicare Other | Attending: Emergency Medicine | Admitting: Emergency Medicine

## 2023-04-09 ENCOUNTER — Encounter (HOSPITAL_COMMUNITY): Payer: Self-pay | Admitting: Emergency Medicine

## 2023-04-09 ENCOUNTER — Other Ambulatory Visit: Payer: Self-pay

## 2023-04-09 DIAGNOSIS — R479 Unspecified speech disturbances: Secondary | ICD-10-CM

## 2023-04-09 DIAGNOSIS — R4781 Slurred speech: Secondary | ICD-10-CM | POA: Diagnosis not present

## 2023-04-09 DIAGNOSIS — R5383 Other fatigue: Secondary | ICD-10-CM | POA: Insufficient documentation

## 2023-04-09 DIAGNOSIS — I4891 Unspecified atrial fibrillation: Secondary | ICD-10-CM | POA: Diagnosis not present

## 2023-04-09 DIAGNOSIS — R519 Headache, unspecified: Secondary | ICD-10-CM | POA: Diagnosis present

## 2023-04-09 DIAGNOSIS — Z7901 Long term (current) use of anticoagulants: Secondary | ICD-10-CM | POA: Insufficient documentation

## 2023-04-09 DIAGNOSIS — Z7982 Long term (current) use of aspirin: Secondary | ICD-10-CM | POA: Diagnosis not present

## 2023-04-09 LAB — CBC WITH DIFFERENTIAL/PLATELET
Abs Immature Granulocytes: 0.03 10*3/uL (ref 0.00–0.07)
Basophils Absolute: 0.1 10*3/uL (ref 0.0–0.1)
Basophils Relative: 1 %
Eosinophils Absolute: 0.2 10*3/uL (ref 0.0–0.5)
Eosinophils Relative: 4 %
HCT: 42.4 % (ref 39.0–52.0)
Hemoglobin: 14.7 g/dL (ref 13.0–17.0)
Immature Granulocytes: 1 %
Lymphocytes Relative: 28 %
Lymphs Abs: 1.8 10*3/uL (ref 0.7–4.0)
MCH: 34.3 pg — ABNORMAL HIGH (ref 26.0–34.0)
MCHC: 34.7 g/dL (ref 30.0–36.0)
MCV: 99.1 fL (ref 80.0–100.0)
Monocytes Absolute: 0.8 10*3/uL (ref 0.1–1.0)
Monocytes Relative: 12 %
Neutro Abs: 3.7 10*3/uL (ref 1.7–7.7)
Neutrophils Relative %: 54 %
Platelets: 187 10*3/uL (ref 150–400)
RBC: 4.28 MIL/uL (ref 4.22–5.81)
RDW: 13.2 % (ref 11.5–15.5)
WBC: 6.6 10*3/uL (ref 4.0–10.5)
nRBC: 0 % (ref 0.0–0.2)

## 2023-04-09 LAB — BASIC METABOLIC PANEL
Anion gap: 14 (ref 5–15)
BUN: 19 mg/dL (ref 8–23)
CO2: 21 mmol/L — ABNORMAL LOW (ref 22–32)
Calcium: 9.2 mg/dL (ref 8.9–10.3)
Chloride: 99 mmol/L (ref 98–111)
Creatinine, Ser: 1.25 mg/dL — ABNORMAL HIGH (ref 0.61–1.24)
GFR, Estimated: 59 mL/min — ABNORMAL LOW (ref >60)
Glucose, Bld: 103 mg/dL — ABNORMAL HIGH (ref 70–99)
Potassium: 4 mmol/L (ref 3.5–5.1)
Sodium: 134 mmol/L — ABNORMAL LOW (ref 135–145)

## 2023-04-09 LAB — URINALYSIS, ROUTINE W REFLEX MICROSCOPIC
Bilirubin Urine: NEGATIVE
Glucose, UA: NEGATIVE mg/dL
Hgb urine dipstick: NEGATIVE
Ketones, ur: NEGATIVE mg/dL
Leukocytes,Ua: NEGATIVE
Nitrite: NEGATIVE
Protein, ur: NEGATIVE mg/dL
Specific Gravity, Urine: 1.006 (ref 1.005–1.030)
pH: 7 (ref 5.0–8.0)

## 2023-04-09 LAB — PROTIME-INR
INR: 1.1 (ref 0.8–1.2)
Prothrombin Time: 13.7 seconds (ref 11.4–15.2)

## 2023-04-09 LAB — CBG MONITORING, ED: Glucose-Capillary: 110 mg/dL — ABNORMAL HIGH (ref 70–99)

## 2023-04-09 MED ORDER — IOHEXOL 350 MG/ML SOLN
50.0000 mL | Freq: Once | INTRAVENOUS | Status: AC | PRN
  Administered 2023-04-09: 50 mL via INTRAVENOUS

## 2023-04-09 NOTE — ED Provider Notes (Signed)
EMERGENCY DEPARTMENT AT [facility] Provider Note   CSN: [id number] Arrival date & time: 04/09/23  8295     History  Chief Complaint  Patient presents with   Headache    [name] is a 77 y.o. male.  He has a prior history of TIA.  He tells me he now is taking Eliquis for A-fib.  Complaining of right-sided headache associated with some intermittent slurred speech and unsteady gait that started around 11 PM last night.  He said this is different than his TIA which was slurred speech along with left eye drooping and some problems using his left hand.  Symptoms seem to come and go.  The history is provided by the patient and the spouse.  Headache Pain location:  R temporal Quality:  Dull Timing:  Intermittent Progression:  Unchanged Chronicity:  New Relieved by:  None tried Worsened by:  Nothing Ineffective treatments:  None tried Associated symptoms: fatigue and loss of balance   Associated symptoms: no blurred vision, no cough, no paresthesias, no visual change and no vomiting        Home Medications Prior to Admission medications   Medication Sig Start Date End Date Taking? Authorizing Provider  acetaminophen (TYLENOL) 650 MG CR tablet Take 650 mg by mouth 3 (three) times daily. For arthritis pain    [provider]  amLODipine (NORVASC) 5 MG tablet Take 10 mg by mouth daily.  08/18/18   [provider]  aspirin EC 81 MG tablet Take 325 mg by mouth at bedtime.     [provider]  atorvastatin (LIPITOR) 80 MG tablet Take 1 tablet (80 mg total) by mouth daily at 6 PM. 09/01/18   Rolly Salter, MD  clopidogrel (PLAVIX) 75 MG tablet Take 75 mg by mouth daily.    [provider]  Coenzyme Q10 200 MG capsule Take 1 capsule (200 mg total) by mouth daily. 10/19/18   Micki Riley, MD  doxazosin (CARDURA) 1 MG tablet Take 1 mg by mouth at bedtime.     [provider]  EPINEPHrine (EPIPEN 2-PAK) 0.3 mg/0.3 mL  IJ SOAJ injection Inject 0.3 mLs (0.3 mg total) into the muscle once as needed (for severe allergic reaction). CAll 911 immediately if you have to use this medicine Patient not taking: Reported on 12/30/2018 12/15/16   Antony Madura, PA-C  fluticasone Aleda Grana) 50 MCG/ACT nasal spray  11/30/18   [provider]  isosorbide mononitrate (IMDUR) 30 MG 24 hr tablet Take 30 mg by mouth daily. 11/30/18   [provider]  loratadine (CLARITIN) 10 MG tablet Take 10 mg by mouth daily.    [provider]  metoprolol succinate (TOPROL-XL) 50 MG 24 hr tablet Take 1 tablet (50 mg total) by mouth daily. Patient takes at lunchtime Take with or immediately following a meal. Patient taking differently: Take 25 mg by mouth daily. Patient takes at lunchtime Take with or immediately following a meal. 04/01/12   Rinaldo Cloud, MD  Multiple Vitamin (MULTIVITAMIN WITH MINERALS) TABS tablet Take 1 tablet by mouth daily.    [provider]  nitroGLYCERIN (NITROSTAT) 0.4 MG SL tablet Place 0.4 mg under the tongue every 5 (five) minutes as needed. For chest pain    [provider]  Omega-3 Fatty Acids (FISH OIL PO) Take 1,200 mg by mouth daily. Patient takes 1 in the morning, 1 at lunchtime, 2 with dinner     [provider]  ranitidine (ZANTAC)  150 MG tablet Take 150 mg by mouth 2 (two) times daily as needed for heartburn.    [provider]  sertraline (ZOLOFT) 25 MG tablet Take 25 mg by mouth daily. 08/18/18   [provider]  [facility] Wort ([facility] WORT) 150 MG CAPS Take 300 mg by mouth every morning.    [provider]      Allergies    Niacin and related and Other    Review of Systems   Review of Systems  Constitutional:  Positive for fatigue.  Eyes:  Negative for blurred vision and visual disturbance.  Respiratory:  Negative for cough.   Gastrointestinal:  Negative for vomiting.  Neurological:  Positive for speech difficulty,  headaches and loss of balance. Negative for paresthesias.    Physical Exam Updated Vital Signs BP (!) 178/77 (BP Location: Right Arm)   Pulse (!) 56   Temp 97.6 F (36.4 C)   Resp 18   Ht  (1.6 m)   Wt 59 kg   SpO2 97%   BMI 23.03 kg/m  Physical Exam Vitals and nursing note reviewed.  Constitutional:      General: He is not in acute distress.    Appearance: He is well-developed.  HENT:     Head: Normocephalic and atraumatic.  Eyes:     Conjunctiva/sclera: Conjunctivae normal.  Cardiovascular:     Rate and Rhythm: Regular rhythm. Bradycardia present.     Heart sounds: No murmur heard. Pulmonary:     Effort: Pulmonary effort is normal. No respiratory distress.     Breath sounds: Normal breath sounds.  Abdominal:     Palpations: Abdomen is soft.     Tenderness: There is no abdominal tenderness.  Musculoskeletal:        General: No swelling.     Cervical back: Neck supple.  Skin:    General: Skin is warm and dry.     Capillary Refill: Capillary refill takes less than 2 seconds.  Neurological:     Mental Status: He is alert.     GCS: GCS eye subscore is 4. GCS verbal subscore is 5. GCS motor subscore is 6.     Cranial Nerves: No cranial nerve deficit, dysarthria or facial asymmetry.     Sensory: No sensory deficit.     Motor: No weakness.     ED Results / Procedures / Treatments   Labs (all labs ordered are listed, but only abnormal results are displayed) Labs Reviewed  URINALYSIS, ROUTINE W REFLEX MICROSCOPIC - Abnormal; Notable for the following components:      Result Value   Color, Urine STRAW (*)    All other components within normal limits  BASIC METABOLIC PANEL - Abnormal; Notable for the following components:   Sodium 134 (*)    CO2 21 (*)    Glucose, Bld 103 (*)    Creatinine, Ser 1.25 (*)    GFR, Estimated 59 (*)    All other components within normal limits  CBC WITH DIFFERENTIAL/PLATELET - Abnormal; Notable for the following components:   MCH  34.3 (*)    All other components within normal limits  CBG MONITORING, ED - Abnormal; Notable for the following components:   Glucose-Capillary 110 (*)    All other components within normal limits  PROTIME-INR    EKG None  Radiology MR BRAIN WO CONTRAST  Result Date: 04/09/2023 CLINICAL DATA:  Neuro deficit, acute, stroke suspected. EXAM: MRI HEAD WITHOUT CONTRAST TECHNIQUE: Multiplanar, multiecho pulse  sequences of the brain and surrounding structures were obtained without intravenous contrast. COMPARISON:  CTA head/neck 04/09/2023. FINDINGS: Brain: No acute infarct or hemorrhage. Mild chronic small-vessel disease. No hydrocephalus or extra-axial collection. No mass or midline shift. No foci of abnormal susceptibility. Vascular: Normal flow voids. Skull and upper cervical spine: Normal marrow signal. Sinuses/Orbits: Unremarkable. Other: None. IMPRESSION: 1. No acute intracranial process. 2. Mild chronic small-vessel disease. Electronically Signed   By: Orvan Falconer M.D.   On: 04/09/2023 10:56   CT ANGIO HEAD NECK W WO CM  Result Date: 04/09/2023 CLINICAL DATA:  Stroke, determine embolic source. EXAM: CT ANGIOGRAPHY HEAD AND NECK WITH AND WITHOUT CONTRAST TECHNIQUE: Multidetector CT imaging of the head and neck was performed using the standard protocol during bolus administration of intravenous contrast. Multiplanar CT image reconstructions and MIPs were obtained to evaluate the vascular anatomy. Carotid stenosis measurements (when applicable) are obtained utilizing NASCET criteria, using the distal internal carotid diameter as the denominator. RADIATION DOSE REDUCTION: This exam was performed according to the departmental dose-optimization program which includes automated exposure control, adjustment of the mA and/or kV according to patient size and/or use of iterative reconstruction technique. CONTRAST:  50mL OMNIPAQUE IOHEXOL 350 MG/ML SOLN COMPARISON:  08/31/2018 FINDINGS: CTA NECK FINDINGS  Aortic arch: Atheromatous plaque with 3 vessel branching Right carotid system: Moderate calcified plaque at the bifurcation. No stenosis or ulceration Left carotid system: Moderate calcified plaque at the bifurcation. No stenosis or ulceration. Vertebral arteries: No proximal subclavian stenosis. Atheromatous plaque at both vertebral origins with at least 50% stenosis on the right. No ulceration or beading. Skeleton: Generalized cervical spine degeneration. Other neck: Atrophic left submandibular gland with small parenchymal and proximal ductal calculi. Upper chest: No acute finding Review of the MIP images confirms the above findings CTA HEAD FINDINGS Anterior circulation: Atheromatous calcification of the carotid siphons. No branch occlusion, beading, or aneurysm. Posterior circulation: Extensive atheromatous plaque of the vertebral arteries. The basilar is smoothly contoured and widely patent. Fetal type left PCA. Venous sinuses: Unremarkable for arterial timing Anatomic variants: As above Review of the MIP images confirms the above findings IMPRESSION: 1. No emergent finding. 2. Atherosclerosis most notably causing 50% right vertebral origin stenosis. 3. 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 Electronically Signed   By: Tiburcio Pea M.D.   On: 04/09/2023 07:54   CT Head Wo Contrast  Result Date: 04/09/2023 CLINICAL DATA:  Headache. EXAM: CT HEAD WITHOUT CONTRAST TECHNIQUE: Contiguous axial images were obtained from the base of the skull through the vertex without intravenous contrast. RADIATION DOSE REDUCTION: This exam was performed according to the departmental dose-optimization program which includes automated exposure control, adjustment of the mA and/or kV according to patient size and/or use of iterative reconstruction technique. COMPARISON:  01/01/2019 FINDINGS: Brain: There is no evidence for acute hemorrhage, hydrocephalus, mass lesion, or abnormal extra-axial fluid collection.  No definite CT evidence for acute infarction. Vascular: No hyperdense vessel or unexpected calcification. Skull: No evidence for fracture. No worrisome lytic or sclerotic lesion. Sinuses/Orbits: The visualized paranasal sinuses and mastoid air cells are clear. Visualized portions of the globes and intraorbital fat are unremarkable. Other: None. IMPRESSION: No acute intracranial abnormality. Electronically Signed   By: Kennith Center M.D.   On: 04/09/2023 06:56    Procedures Procedures    Medications Ordered in ED Medications - No data to display  ED Course/ Medical Decision Making/ A&P Clinical Course as of 04/09/23 1656  Wed Apr 09, 2023  1102 Reviewed results  with patient.  He is comfortable plan for outpatient follow-up with his neurologist [MB]    Clinical Course User Index [MB] Terrilee Files, MD                             Medical Decision Making Amount and/or Complexity of Data Reviewed Radiology: ordered.  Risk Prescription drug management.   This patient complains of right-sided headache intermittent slurred speech intermittent gait imbalance; this involves an extensive number of treatment Options and is a complaint that carries with it a high risk of complications and morbidity. The differential includes stroke, bleed, migraine, tension headache, dehydration  I ordered, reviewed and interpreted labs, which included CBC with normal white count normal hemoglobin, chemistries with mildly low bicarb elevated creatinine, urinalysis without signs of infection I ordered imaging studies which included CT head, angio head and neck, MRI brain and I independently    visualized and interpreted imaging which showed no acute findings Additional history obtained from patient's wife Previous records obtained and reviewed in epic including prior admission discharge summary and neurology notes Cardiac monitoring reviewed, sinus rhythm Social determinants considered, no significant  barriers Critical Interventions: None  After the interventions stated above, I reevaluated the patient and found patient to be awake alert with steady gait Admission and further testing considered, no indications for admission or further workup at this time.  Recommended patient follow-up with his outpatient neurologist and primary care doctor.  Return instructions discussed         Final Clinical Impression(s) / ED Diagnoses Final diagnoses:  Generalized headache  Speech disturbance, unspecified type    Rx / DC Orders ED Discharge Orders     None         Terrilee Files, MD 04/09/23 [phone]

## 2023-04-09 NOTE — ED Provider Triage Note (Signed)
Emergency Medicine Provider Triage Evaluation Note  [name] , a 77 y.o. male  was evaluated in triage.  Pt complains of headache, states this started around 5 PM yesterday, states it feels on the right side of his head, pressure-like pain, will come and go, no associated change in vision paresthesias or weakness of the lower extremities.  He states that he went to bed and then woke up this morning use restroom, states he feels as if he is having hard time getting his words out and felt slightly off balance, states that his balance has improved but still feels like he is having hard time speaking.  He is currently on Eliquis, has not missed any dosages denies any recent falls.  Review of Systems  Positive: Headaches, feeling off balance Negative: Nausea vomiting  Physical Exam  BP (!) 178/77 (BP Location: Right Arm)   Pulse (!) 56   Temp 97.6 F (36.4 C)   Resp 18   Ht  (1.6 m)   Wt 59 kg   SpO2 97%   BMI 23.03 kg/m  Gen:   Awake, no distress   Resp:  Normal effort  MSK:   Moves extremities without difficulty  Other:  Cranial nerves II through XII grossly intact, no slurring of his words, following two-step commands, no weakness present, gait fully intact, finger-to-nose intact, visual fields grossly intact  Medical Decision Making  Medically screening exam initiated at 5:49 AM.  Appropriate orders placed.  Benjie Karvonen Posa was informed that the remainder of the evaluation will be completed by another provider, this initial triage assessment does not replace that evaluation, and the importance of remaining in the ED until their evaluation is complete.  Patient is outside stroke window, will not initiate code stroke at this time, there is no focal deficits, we will send down for CT head for further evaluation.   Carroll Sage, PA-C 04/09/23 [phone]

## 2023-04-09 NOTE — ED Triage Notes (Addendum)
Pt reports Intermittent headache since 5pm and he feels like his speech is slurred. Endorses feeling off balance. Stroke screen negative with ems. On eliquis - denies recent falls/injuries.  20g RFA  BP 162/68, HR 68, Spo2 98% CBG 122

## 2023-04-09 NOTE — ED Notes (Signed)
Patient transported to MRI 

## 2023-04-09 NOTE — Discharge Instructions (Signed)
You were seen in the emergency department for headache along with some intermittent slurred speech and unbalanced gait.  You had blood work CAT scan and MRI of your brain that did not show any evidence of stroke.  Please continue regular medications and follow-up with your primary care doctor and your neurologist.  Return to the emergency department if any worsening or concerning symptoms.

## 2023-07-07 ENCOUNTER — Encounter: Payer: Self-pay | Admitting: Neurology

## 2023-07-07 ENCOUNTER — Telehealth: Payer: Self-pay | Admitting: Neurology

## 2023-07-07 ENCOUNTER — Ambulatory Visit: Payer: Medicare Other | Admitting: Neurology

## 2023-07-07 VITALS — BP 129/68 | HR 52 | Ht 63.0 in | Wt 135.8 lb

## 2023-07-07 DIAGNOSIS — G459 Transient cerebral ischemic attack, unspecified: Secondary | ICD-10-CM | POA: Diagnosis not present

## 2023-07-07 DIAGNOSIS — G3184 Mild cognitive impairment, so stated: Secondary | ICD-10-CM

## 2023-07-07 DIAGNOSIS — R413 Other amnesia: Secondary | ICD-10-CM

## 2023-07-07 NOTE — Telephone Encounter (Signed)
[employer] medicare Berkley Harvey: [phone] exp. 07/07/23-08/05/23 sent to GI [phone]

## 2023-07-07 NOTE — Patient Instructions (Addendum)
I had a long d/w patient about his recent TIA, mild cognitive impairment,risk for recurrent stroke/TIAs, personally independently reviewed imaging studies and stroke evaluation results and answered questions.Continue  Eliquis daily  for secondary stroke prevention given h/o atrial fibrillation  and maintain strict control of hypertension with blood pressure goal below 130/90, diabetes with hemoglobin A1c goal below 6.5% and lipids with LDL cholesterol goal below 70 mg/dL. I also advised the patient to eat a healthy diet with plenty of whole grains, cereals, fruits and vegetables, exercise regularly and maintain ideal body weight . Recommend memory panel labs, EEG and MRI brain w/wo and increase participation in mentally challenging activities like solving crossword puzzles, playing bridge or sudoku. We also discussed memory compensation strategies.  Followup in the future with my nurse practitioner Shanda Bumps in 4 months or call earlier  f necessary   Memory Compensation Strategies  Use "WARM" strategy.  W= write it down  A= associate it  R= repeat it  M= make a mental note  2.   You can keep a Glass blower/designer.  Use a 3-ring notebook with sections for the following: calendar, important names and phone numbers,  medications, doctors' names/phone numbers, lists/reminders, and a section to journal what you did  each day.   3.    Use a calendar to write appointments down.  4.    Write yourself a schedule for the day.  This can be placed on the calendar or in a separate section of the Memory Notebook.  Keeping a  regular schedule can help memory.  5.    Use medication organizer with sections for each day or morning/evening pills.  You may need help loading it  6.    Keep a basket, or pegboard by the door.  Place items that you need to take out with you in the basket or on the pegboard.  You may also want to  include a message board for reminders.  7.    Use sticky notes.  Place sticky notes with  reminders in a place where the task is performed.  For example: " turn off the  stove" placed by the stove, "lock the door" placed on the door at eye level, " take your medications" on  the bathroom mirror or by the place where you normally take your medications.  8.    Use alarms/timers.  Use while cooking to remind yourself to check on food or as a reminder to take your medicine, or as a  reminder to make a call, or as a reminder to perform another task, etc.

## 2023-07-07 NOTE — Progress Notes (Signed)
[facility] [address] [place]. [place] [address] [phone]       OFFICE CONSULT NOTE  Mr. [name] Date of Birth:  [birth year] Medical Record Number:  [id number]   Referring MD: Berle Mull, PA-C  Reason for Referral: TIA and memory loss  HPI: Mr. [name] is a 77 year old pleasant Caucasian male seen today for initial office consultation visit.  History is obtained from the patient and review of electronic medical records.  I personally reviewed pertinent available imaging films in PACS.  He has past medical history of hypertension, coronary artery disease, gastroesophageal reflux disease and right hemispheric TIA in September 2019. He presented to the emergency room on 04/09/2023 with intermittent headaches and some slurred speech and unsteady gait which began the night before.  His symptoms are fluctuating.  CT head was unremarkable.  MRI scan showed no acute abnormality only changes of small vessel disease.  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  Patient had been diagnosed with atrial fibrillation 2 years prior and was on Eliquis which she had not missed.  This was continued.  He states is done well since then without recurrent TIA or stroke symptoms.  He has remote history of right hemispheric TIA in September 2019 at that time he was seen by me and given significant coronary artery disease was advised to stay on aspirin and Plavix for his cardiac stent.  He was last seen in the office in 2020 and did not follow-up since.  Patient also complains of mild memory difficulties which has had for couple of years but seem worse in the last 3 months or so.  He has trouble finding words and often has to stop and midsentence.  At times his mind goes blank.  He has difficult time remembering names.  He is quite independent in activities of daily living.  He is living at home.  He does admit to  decrease in attention at times and decreased recall.  He has not had any workup for reversible causes of memory loss. ROS:   14 system review of systems is positive for memory loss, word finding difficulty, gait imbalance, headache all other systems negative  PMH:  Past Medical History:  Diagnosis Date   Angina    Coronary artery disease    GERD (gastroesophageal reflux disease)    Hypertension     Social History:  Social History   Socioeconomic History   Marital status: Married    Spouse name: Not on file   Number of children: Not on file   Years of education: Not on file   Highest education level: Not on file  Occupational History   Not on file  Tobacco Use   Smoking status: Never   Smokeless tobacco: Never  Substance and Sexual Activity   Alcohol use: No   Drug use: No   Sexual activity: Not Currently  Other Topics Concern   Not on file  Social History Narrative   Not on file   Social Determinants of Health   Financial Resource Strain: Not on file  Food Insecurity: Not on file  Transportation Needs: Not on file  Physical Activity: Not on file  Stress: Not on file  Social Connections: Not on file  Intimate Partner Violence: Not on file    Medications:   Current Outpatient Medications on File Prior to Visit  Medication Sig Dispense Refill   acetaminophen (TYLENOL) 650 MG CR tablet  Take 650 mg by mouth 3 (three) times daily. For arthritis pain     amiodarone (PACERONE) 200 MG tablet Take 200 mg by mouth daily.     amLODipine (NORVASC) 5 MG tablet Take 10 mg by mouth daily.   2   aspirin EC 81 MG tablet Take 325 mg by mouth at bedtime.      atorvastatin (LIPITOR) 80 MG tablet Take 1 tablet (80 mg total) by mouth daily at 6 PM. 30 tablet 0   Coenzyme Q10 200 MG capsule Take 1 capsule (200 mg total) by mouth daily. 30 capsule 1   ELIQUIS 5 MG TABS tablet Take 5 mg by mouth 2 (two) times daily.     isosorbide mononitrate (IMDUR) 30 MG 24 hr tablet Take 30 mg by  mouth daily.     loratadine (CLARITIN) 10 MG tablet Take 10 mg by mouth daily.     Multiple Vitamin (MULTIVITAMIN WITH MINERALS) TABS tablet Take 1 tablet by mouth daily.     nitroGLYCERIN (NITROSTAT) 0.4 MG SL tablet Place 0.4 mg under the tongue every 5 (five) minutes as needed. For chest pain     Omega-3 Fatty Acids (FISH OIL PO) Take 1,200 mg by mouth daily. Patient takes 1 in the morning, 1 at lunchtime, 2 with dinner      sertraline (ZOLOFT) 25 MG tablet Take 25 mg by mouth daily.  2   clopidogrel (PLAVIX) 75 MG tablet Take 75 mg by mouth daily. (Patient not taking: Reported on 07/07/2023)     doxazosin (CARDURA) 1 MG tablet Take 1 mg by mouth at bedtime.  (Patient not taking: Reported on 07/07/2023)     EPINEPHrine (EPIPEN 2-PAK) 0.3 mg/0.3 mL IJ SOAJ injection Inject 0.3 mLs (0.3 mg total) into the muscle once as needed (for severe allergic reaction). CAll 911 immediately if you have to use this medicine (Patient not taking: Reported on 12/30/2018) 1 Device 1   fluticasone (FLONASE) 50 MCG/ACT nasal spray  (Patient not taking: Reported on 07/07/2023)     metoprolol succinate (TOPROL-XL) 50 MG 24 hr tablet Take 1 tablet (50 mg total) by mouth daily. Patient takes at lunchtime Take with or immediately following a meal. (Patient not taking: Reported on 07/07/2023) 3 tablet 30   ranitidine (ZANTAC) 150 MG tablet Take 150 mg by mouth 2 (two) times daily as needed for heartburn. (Patient not taking: Reported on 07/07/2023)     [place] Johns Wort ([employer] [facility] WORT) 150 MG CAPS Take 300 mg by mouth every morning. (Patient not taking: Reported on 07/07/2023)     No current facility-administered medications on file prior to visit.    Allergies:   Allergies  Allergen Reactions   Niacin And Related    Other Other (See Comments)    Febreze(lavender) causes itching     Physical Exam General: Frail malnourished looking elderly Caucasian male, seated, in no evident distress Head: head normocephalic and  atraumatic.   Neck: supple with no carotid or supraclavicular bruits Cardiovascular: regular rate and rhythm, no murmurs Musculoskeletal: no deformity Skin:  no rash but scattered petichiae b/l forearms Vascular:  Normal pulses all extremities  Neurologic Exam Mental Status: Awake and fully alert. Oriented to place and time. Recent and remote memory intact. Attention span, concentration and fund of knowledge appropriate. Mood and affect appropriate.  Diminished recall 1/3.  Able to name 13 animals which can walk on 4 legs. Cranial Nerves: Fundoscopic exam reveals sharp disc margins. Pupils equal, briskly reactive to  light. Extraocular movements full without nystagmus. Visual fields full to confrontation. Hearing diminished bilaterally facial sensation intact. Face, tongue, palate moves normally and symmetrically.  Motor: Normal bulk and tone. Normal strength in all tested extremity muscles. Sensory.: intact to touch , pinprick , position and vibratory sensation.  Coordination: Rapid alternating movements normal in all extremities. Finger-to-nose and heel-to-shin performed accurately bilaterally. Gait and Station: Arises from chair without difficulty. Stance is normal. Gait demonstrates normal stride length and balance . Able to heel, toe and tandem walk with moderate difficulty.  Reflexes: 1+ and symmetric. Toes downgoing.   NIHSS  0 Modified Rankin  1    07/07/2023    3:05 PM  MMSE - Mini Mental State Exam  Orientation to time 5  Orientation to Place 5  Registration 3  Attention/ Calculation 5  Recall 3  Language- name 2 objects 2  Language- repeat 0  Language- follow 3 step command 3  Language- read & follow direction 1  Write a sentence 1  Copy design 0  Total score 28     ASSESSMENT: 77 year old Caucasian male with recent episode of slurred speech and unsteady gait likely due to posterior circulation TIA from atrial fibrillation despite anticoagulation on Eliquis.  Prior  history of right hemispheric subcortical TIA in September 2019 from small vessel disease.  Vascular risk factors of atrial fibrillation, hyperlipidemia, hypertension and age.  He also has mild memory and cognitive difficulties due to age-appropriate mild cognitive impairment.     PLAN:I had a long d/w patient about his recent TIA, mild cognitive impairment,risk for recurrent stroke/TIAs, personally independently reviewed imaging studies and stroke evaluation results and answered questions.Continue  Eliquis daily  for secondary stroke prevention given h/o atrial fibrillation  and maintain strict control of hypertension with blood pressure goal below 130/90, diabetes with hemoglobin A1c goal below 6.5% and lipids with LDL cholesterol goal below 70 mg/dL. I also advised the patient to eat a healthy diet with plenty of whole grains, cereals, fruits and vegetables, exercise regularly and maintain ideal body weight . Recommend memory panel labs, EEG and MRI brain w/wo and increase participation in mentally challenging activities like solving crossword puzzles, playing bridge or sudoku. We also discussed memory compensation strategies.  Followup in the future with my nurse practitioner Shanda Bumps in 4 months or call earlier  f necessary  Greater than 50% time during this 60-minute consultation visit was spent on counseling and coordination of care about his TIAs, atrial fibrillation as well as mild cognitive impairment and answering questions.  Note: This document was prepared with digital dictation and possible smart phrase technology. Any transcriptional errors that result from this process are unintentional.

## 2023-07-08 LAB — DEMENTIA PANEL
Homocysteine: 14.5 umol/L (ref 0.0–19.2)
RPR Ser Ql: NONREACTIVE
TSH: 50.4 u[IU]/mL — ABNORMAL HIGH (ref 0.450–4.500)
Vitamin B-12: 441 pg/mL (ref 232–1245)

## 2023-07-14 NOTE — Telephone Encounter (Signed)
Pt said was informed by [employer] MRI authorization expires on 08/05/23. MRI is scheduled 9/10/2. Need to call [facility] to have neurologist get an extension.

## 2023-07-14 NOTE — Telephone Encounter (Signed)
I will get a new authorization the beginning of September.

## 2023-07-15 ENCOUNTER — Ambulatory Visit: Payer: Medicare Other | Admitting: Neurology

## 2023-07-15 DIAGNOSIS — R4182 Altered mental status, unspecified: Secondary | ICD-10-CM

## 2023-07-15 DIAGNOSIS — R413 Other amnesia: Secondary | ICD-10-CM

## 2023-07-21 ENCOUNTER — Other Ambulatory Visit: Payer: Medicare Other | Admitting: *Deleted

## 2023-07-23 ENCOUNTER — Telehealth: Payer: Self-pay | Admitting: Neurology

## 2023-07-23 NOTE — Progress Notes (Signed)
Kindly inform the patient that EEG of brainwave study was normal.  No seizure activity noted.

## 2023-07-23 NOTE — Telephone Encounter (Signed)
-----   Message from Delia Heady sent at 07/23/2023  1:52 PM EDT ----- Joneen Roach inform the patient that EEG of brainwave study was normal.  No seizure activity noted

## 2023-07-23 NOTE — Telephone Encounter (Signed)
Called the pt and reviewed EEG results informed of the normal finding. Pt verbalized understanding. Pt had no questions at this time but was encouraged to call back if questions arise.  Pt states that he is waiting to hear about insurance coverage for MRI being extended because they were unable to get him scheduled in the approved window. I advised I would pass this along.

## 2023-08-19 NOTE — Telephone Encounter (Signed)
[employer] medicare Berkley Harvey: [phone] exp. 08/19/23-09/17/23 for GI

## 2023-08-26 ENCOUNTER — Ambulatory Visit
Admission: RE | Admit: 2023-08-26 | Discharge: 2023-08-26 | Disposition: A | Discharge status: Home / Self Care | Payer: Medicare Other | Source: Ambulatory Visit | Attending: Neurology | Admitting: Neurology

## 2023-08-26 DIAGNOSIS — R413 Other amnesia: Secondary | ICD-10-CM | POA: Diagnosis not present

## 2023-08-26 MED ORDER — GADOPICLENOL 0.5 MMOL/ML IV SOLN
6.0000 mL | Freq: Once | INTRAVENOUS | Status: AC | PRN
  Administered 2023-08-26: 6 mL via INTRAVENOUS

## 2023-09-12 NOTE — Progress Notes (Signed)
Kindly inform the patient that MRI scan of the brain shows mild age-related changes of hardening of the arteries and shrinkage of the brain.  Nothing to worry about.  No significant change compared with previous MRI from April 2024.

## 2023-11-17 ENCOUNTER — Encounter: Payer: Self-pay | Admitting: Adult Health

## 2023-11-17 ENCOUNTER — Ambulatory Visit (INDEPENDENT_AMBULATORY_CARE_PROVIDER_SITE_OTHER): Payer: Medicare Other | Admitting: Adult Health

## 2023-11-17 VITALS — BP 120/66 | HR 58 | Ht 62.0 in | Wt 135.0 lb

## 2023-11-17 DIAGNOSIS — G459 Transient cerebral ischemic attack, unspecified: Secondary | ICD-10-CM | POA: Diagnosis not present

## 2023-11-17 DIAGNOSIS — R2990 Unspecified symptoms and signs involving the nervous system: Secondary | ICD-10-CM

## 2023-11-17 DIAGNOSIS — R7989 Other specified abnormal findings of blood chemistry: Secondary | ICD-10-CM | POA: Diagnosis not present

## 2023-11-17 DIAGNOSIS — G3184 Mild cognitive impairment, so stated: Secondary | ICD-10-CM

## 2023-11-17 NOTE — Patient Instructions (Addendum)
Your Plan:  Will recheck thyroid levels today - if normal, will refer you to see our sleep and movement specialist to evaluate for possible underlying movement and/or sleep disorder. If abnormal, will recommend follow up with your PCP ASAP or will place referral to endocrinology   Continue current stroke prevention medications and ensure routine follow up with your PCP and cardiologist   Would recommend increasing routinely physical activity and exercise and routine memory exercises       Follow up in 6 months or call earlier if needed       Thank you for coming to see [place] at [facility]. I hope we have been able to provide you high quality care today.  You may receive a patient satisfaction survey over the next few weeks. We would appreciate your feedback and comments so that we may continue to improve ourselves and the health of our patients.

## 2023-11-17 NOTE — Progress Notes (Signed)
[facility] [address] [place]. [place] [address] [phone]       OFFICE FOLLOW UP NOTE  [name] Date of Birth:  September 18, 1946 Medical Record Number:  [id number]   Primary neurologist: Dr. Pearlean Brownie Reason for visit: Memory complaints, hx of TIA   Chief Complaint  Patient presents with   Follow-up    Patient in room #3 and alone. Patient states states he having memory issues and feeling imbalance.       HPI:   Update 11/17/2023 JM: Patient returns for follow-up visit unaccompanied.    Reports continued memory difficulties but no significant decline since prior visit. MMSE today 29/30 (prior 28/30). He continues to have issues with short-term memory and concentration difficulties but has had this problem since grade school, he questions possible undiagnosed ADHD contributing.  He can lose his train of thought midsentence especially if interrupted.  He has difficulty remembering names.  He continues to maintain ADLs and IADLs independently as well as driving without difficulty. Reports mother likely having dementia but never formally diagnosed, father passed away at age 51 yo without issues with memory.   Continued balance difficulties, had a fall about 2 weeks ago after falling from a ladder (was not up high) and landed on tailbone, had a bruise which has been gradually improving, did not hit head or have any other injuries. No other falls, ambulates without AD.   Mentions possible TIA about 2 months ago, woke up in the middle of the night with slurred speech, left hand contracted and possible left facial weakness, he went back to bed and symptoms resolved by morning but did have increased fatigue for a couple days after.  Denies any associated headache.  Remembers event without any altered mental status or loss of consciousness.  Similar to prior TIA episodes in 08/2018 but was associated with headache and most recent episode not as severe, was evaluated  03/2023 for possible TIA with intermittent slurred speech and unsteady gait associated with headache.  Did not go to ED with recent episode as prior work ups have been unremarkable and did not feel like going through repeat testing.  He has not had any reoccurrence since that time.  He does have generalized daytime fatigue and poor sleep but does admit to being sedentary. Does have nocturia, some nights may only wake up once but other nights may occur 3-4 times. Wife has told him he snores and possible witnessed apnea.  He does report occasional night terrors and can act out his dreams (will kick and punch) but does not happen frequently.  Denies prior sleep study.  Does have issues with constipation, admits to limited water intake, usually only with taking pills, feels bloated if he drinks too much water. Denies swallowing issues. Denies any significant visual concerns, routinely followed by ophthalmology.  Has not noticed any tremors.   Routinely sees PCP once yearly, last visit was in the spring. Has f/u with cardiology 2/7.  Discussed prior workup results including MRI brain which showed chronic small vessel disease and generalized cerebral atrophy, EEG which was negative and dementia panel which showed significantly elevated TSH at 50.4, otherwise unremarkable. Can not see where patient was notified of this result therefore he did not have follow up with PCP nor has had repeat labs since.      History provided for reference purposes only Consult visit 07/07/2023 Dr. Pearlean Brownie: Mr. [name] is a 77 year old pleasant Caucasian male seen today for initial office consultation  visit.  History is obtained from the patient and review of electronic medical records.  I personally reviewed pertinent available imaging films in PACS.  He has past medical history of hypertension, coronary artery disease, gastroesophageal reflux disease and right hemispheric TIA in September 2019. He presented to the emergency room on  04/09/2023 with intermittent headaches and some slurred speech and unsteady gait which began the night before.  His symptoms are fluctuating.  CT head was unremarkable.  MRI scan showed no acute abnormality only changes of small vessel disease.  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  Patient had been diagnosed with atrial fibrillation 2 years prior and was on Eliquis which she had not missed.  This was continued.  He states is done well since then without recurrent TIA or stroke symptoms.  He has remote history of right hemispheric TIA in September 2019 at that time he was seen by me and given significant coronary artery disease was advised to stay on aspirin and Plavix for his cardiac stent.  He was last seen in the office in 2020 and did not follow-up since.  Patient also complains of mild memory difficulties which has had for couple of years but seem worse in the last 3 months or so.  He has trouble finding words and often has to stop and midsentence.  At times his mind goes blank.  He has difficult time remembering names.  He is quite independent in activities of daily living.  He is living at home.  He does admit to decrease in attention at times and decreased recall.  He has not had any workup for reversible causes of memory loss.   ROS:   14 system review of systems is positive for those listed in HPI and all other systems negative  PMH:  Past Medical History:  Diagnosis Date   Angina    Coronary artery disease    GERD (gastroesophageal reflux disease)    Hypertension     Social History:  Social History   Socioeconomic History   Marital status: Married    Spouse name: Not on file   Number of children: Not on file   Years of education: Not on file   Highest education level: Not on file  Occupational History   Not on file  Tobacco Use   Smoking status: Never   Smokeless tobacco: Never  Substance and Sexual  Activity   Alcohol use: No   Drug use: No   Sexual activity: Not Currently  Other Topics Concern   Not on file  Social History Narrative   Not on file   Social Determinants of Health   Financial Resource Strain: Not on file  Food Insecurity: Not on file  Transportation Needs: Not on file  Physical Activity: Not on file  Stress: Not on file  Social Connections: Not on file  Intimate Partner Violence: Not on file    Medications:   Current Outpatient Medications on File Prior to Visit  Medication Sig Dispense Refill   acetaminophen (TYLENOL) 650 MG CR tablet Take 650 mg by mouth 3 (three) times daily. For arthritis pain     amiodarone (PACERONE) 200 MG tablet Take 200 mg by mouth daily.     amLODipine (NORVASC) 5 MG tablet Take 10 mg by mouth daily.   2   aspirin EC 325 MG tablet Take 325 mg by mouth daily.     atorvastatin (LIPITOR) 80 MG tablet  Take 1 tablet (80 mg total) by mouth daily at 6 PM. 30 tablet 0   Coenzyme Q10 200 MG capsule Take 1 capsule (200 mg total) by mouth daily. 30 capsule 1   ELIQUIS 5 MG TABS tablet Take 5 mg by mouth 2 (two) times daily.     isosorbide mononitrate (IMDUR) 30 MG 24 hr tablet Take 30 mg by mouth daily.     Multiple Vitamin (MULTIVITAMIN WITH MINERALS) TABS tablet Take 1 tablet by mouth daily.     Omega-3 Fatty Acids (FISH OIL PO) Take 1,200 mg by mouth daily. Patient takes 1 in the morning, 1 at lunchtime, 2 with dinner      sertraline (ZOLOFT) 25 MG tablet Take 25 mg by mouth daily.  2   No current facility-administered medications on file prior to visit.    Allergies:   Allergies  Allergen Reactions   Niacin And Related    Other Other (See Comments)    Febreze(lavender) causes itching    Statins     Other Reaction(s): leg weakness    Physical Exam Today's Vitals   11/17/23 1455  BP: 120/66  Pulse: (!) 58  Weight: 135 lb (61.2 kg)  Height: 5\' 2"  (1.575 m)   Body mass index is 24.69 kg/m.   General: Frail malnourished  looking very present elderly Caucasian male, seated, in no evident distress  Neurologic Exam Mental Status: Awake and fully alert. Oriented to place and time. Recent memory mildly impaired and remote memory intact. Attention span, concentration and fund of knowledge appropriate during visit. Mood and affect appropriate.  Intermittent hypophonia.  Slight dysarthria but also noted poor denture (reports baseline speech), no evidence of aphasia. Cranial Nerves: Pupils equal, briskly reactive to light. Extraocular movements full without nystagmus. Visual fields full to confrontation. Hearing diminished bilaterally.  Facial sensation intact. Face, tongue, palate moves normally and symmetrically.  Motor: Normal strength in all tested extremity muscles. Slight RUE intention tremor and cogwheel rigidity, no evidence of resting tremor.  Sensory.: intact to touch , pinprick , position and vibratory sensation.  Coordination: Rapid alternating movements normal in all extremities. Finger-to-nose and heel-to-shin performed accurately bilaterally. Gait and Station: Arises from chair without difficulty. Stance is hunched. Gait demonstrates normal stride length although slight decreased step height bilaterally and decreased arm swing bilaterally. Some difficulty with turns, tandem walking heel toe not attempted Reflexes: 1+ and symmetric. Toes downgoing.      11/17/2023    3:45 PM 07/07/2023    3:05 PM  MMSE - Mini Mental State Exam  Orientation to time 5 5  Orientation to Place 5 5  Registration 3 3  Attention/ Calculation 5 5  Recall 3 3  Language- name 2 objects 2 2  Language- repeat 1 0  Language- follow 3 step command 3 3  Language- read & follow direction 1 1  Write a sentence 1 1  Copy design 0 0  Total score 29 28       ASSESSMENT/PLAN: 77 year old Caucasian male with hx of TIA in 03/2023 (slurred speech, imbalance, headache) and 08/2018 (slurred speech, left facial weakness, left hand weakness,  headache). Hx of atrial fibrillation on Eliquis, HLD, and HTN.  Complains of continued mild memory and balance difficulties. Reports recurrent episode 2 months ago with slurred speech, left facial weakness and left hand weakness.       Recurrent transient symptoms Hx of TIAs  -possible recurrent TIA but with similar symptoms in 2019, 2024 and recently 2 months  ago. Possibly complicated migraine vs partial seizure. Discussed further evaluation today but as he has had prior evaluation which has been unremarkable and recently had MRI brain, he declines interest in repeating at this time  -he was advised to call 911 immediately with any recurrent symptoms/episodes -Continue Eliquis given history of atrial fibrillation, aspirin and atorvastatin 80 mg daily managed/prescribed by PCP/cardiology -Continue close PCP and cardiology follow-up for aggressive stroke risk factor management including BP goal<130/90, and HLD with LDL goal<70  Mild cognitive impairment Parkinsonian symptoms Excessive daytime fatigue -MMSE today 29/30 (prior 28/30) -Dementia panel 06/2023 TSH 50.4 - will repeat thyroid panel today, if remains abnormal, will have patient f/u with PCP ASAP as this could be contributing to his symptoms including parkinsonian symptoms (gait impairment, mild tremor, and cogwheel rigidity). If level now normal or if correct and symptoms persist, will recommend further evaluation.  -possible sleep apnea - will wait for result of thyroid panel and will likely place referral for sleep evaluation (daytime fatigue, insomnia, nocturia, snoring and witnessed apneas)  -MRI brain 08/2023 no acute abnormalities, chronic SVD and generalized atrophy, no change from 03/2023 imaging -EEG 07/2023 normal    Follow-up in 6 months or call earlier if needed     I spent 45 minutes of face-to-face and non-face-to-face time with patient.  This included previsit chart review, lab review, study review, order entry,  electronic health record documentation, patient education and discussion regarding above diagnoses and treatment plan and answered all other questions to patient's satisfaction  Ihor Austin, [facility]  [facility] [address] [place], [place] [address]  Phone [phone] Fax [phone] Note: This document was prepared with digital dictation and possible smart phrase technology. Any transcriptional errors that result from this process are unintentional.

## 2023-11-18 ENCOUNTER — Telehealth: Payer: Self-pay

## 2023-11-18 LAB — THYROID PANEL WITH TSH
Free Thyroxine Index: 0.8 — ABNORMAL LOW (ref 1.2–4.9)
T3 Uptake Ratio: 22 % — ABNORMAL LOW (ref 24–39)
T4, Total: 3.5 ug/dL — ABNORMAL LOW (ref 4.5–12.0)
TSH: 57.2 u[IU]/mL — ABNORMAL HIGH (ref 0.450–4.500)

## 2023-11-18 NOTE — Telephone Encounter (Signed)
Contacted pt, informed him thyroid panel shows continued significantly elevated TSH. Once this is treated, if he is still having daytime fatigue, gait impairment, memory issues and parkinsonian type symptoms, we can do further work up at that time on our end but his thyroid levels need to be corrected first. I contact patients PCP office to see if they can contact patient to have him seen ASAP to have this further evaluated and treated. They will call him to get scheduled. Labs have been faxed to PCP as well.

## 2023-11-18 NOTE — Telephone Encounter (Signed)
-----   Message from Ihor Austin sent at 11/18/2023  8:54 AM EST ----- Patient's thyroid panel shows continued significantly elevated TSH, please contact patients PCP office to see if they can contact patient to have him seen ASAP to have this further evaluated and treated. Once this is treated, if he is still having daytime fatigue, gait impairment, memory issues and parkinsonian type symptoms, we can do further work up at that time on our end but his thyroid levels need to be corrected first.  Please also contact patient to advise him of this, he requested a telephone call to advise him of the results. Thank you.

## 2024-01-28 LAB — LAB REPORT - SCANNED: EGFR: 72

## 2024-02-02 ENCOUNTER — Ambulatory Visit: Payer: Medicare Other | Admitting: Neurology

## 2024-05-27 NOTE — Progress Notes (Signed)
 [facility] [address] [place]. [place] [address] [phone]       OFFICE FOLLOW UP NOTE  [name] Date of Birth:  09/05/1946 Medical Record Number:  [id number]   Primary neurologist: Dr. Janett Medin Reason for visit: Memory complaints, hx of TIA   Chief Complaint  Patient presents with   Transient Ischemic Attack    Rm 3 alone Pt is well, reports he has intermittent imbalance and gait disturbances. He reports he fell 3 times in one day.  He also mentions he has a few headaches a month.  Memory is about the same, he has trouble with retaining information.        HPI:   Update 05/31/2024 JM: Patient returns for follow-up visit unaccompanied.  Cognition overall stable, continues to have difficulty with short term memory loss at times. Difficulty with train of thought. Can have difficulty remembering what he needs at the store, needs to write down. This is frustrating to him.  Does report difficulty with concentration, focusing and remembering things since school age but believes worse over the past few years.  Continues to maintain ADLs and IADLs independently. MMSE today 29/30.   Continues to have issues with his balance, can fluctuate. Reports a fall a few months ago while using bathroom in the middle of the night, thankfully no significant injury. Does have cane at home and in car but rarely uses. He does admit to being overall sedentary although he knows he needs to increase activity and start exercising routinely. He also admits to poor water intake as he will feel full shortly after drinking small amt of water.   Mentions upper extremity tremors at times. Usually worse with increase anxiety. Does not interfere with daily activity or functioning. Denies tremor at rest, in head or lower extremities.  Repeat TSH after prior visit showed persistently elevated TSH at 57.2, T4 3.5, T3 22, he was advised to follow-up with his PCP for correction of this.   He has since been placed on Synthroid currently taking 50 mcg daily. Reports f/u visit in January with lab work but unsure of results. He plans on f/u visit next spring for yearly follow up.   No new or recurrent TIA/stroke symptoms.  Remains on Eliquis and aspirin  and atorvastatin  without side effects.  Blood pressure monitored at home and typically stable.  Reports routine follow-up with cardiology although unable to view via epic.      History provided for reference purposes only Update 11/17/2023 JM: Patient returns for follow-up visit unaccompanied.    Reports continued memory difficulties but no significant decline since prior visit. MMSE today 29/30 (prior 28/30). He continues to have issues with short-term memory and concentration difficulties but has had this problem since grade school, he questions possible undiagnosed ADHD contributing.  He can lose his train of thought midsentence especially if interrupted.  He has difficulty remembering names.  He continues to maintain ADLs and IADLs independently as well as driving without difficulty. Reports mother likely having dementia but never formally diagnosed, father passed away at age 82 yo without issues with memory.   Continued balance difficulties, had a fall about 2 weeks ago after falling from a ladder (was not up high) and landed on tailbone, had a bruise which has been gradually improving, did not hit head or have any other injuries. No other falls, ambulates without AD.   Mentions possible TIA about 2 months ago, woke up in the middle of the night with  slurred speech, left hand contracted and possible left facial weakness, he went back to bed and symptoms resolved by morning but did have increased fatigue for a couple days after.  Denies any associated headache.  Remembers event without any altered mental status or loss of consciousness.  Similar to prior TIA episodes in 08/2018 but was associated with headache and most recent episode not  as severe, was evaluated 03/2023 for possible TIA with intermittent slurred speech and unsteady gait associated with headache.  Did not go to ED with recent episode as prior work ups have been unremarkable and did not feel like going through repeat testing.  He has not had any reoccurrence since that time.  He does have generalized daytime fatigue and poor sleep but does admit to being sedentary. Does have nocturia, some nights may only wake up once but other nights may occur 3-4 times. Wife has told him he snores and possible witnessed apnea.  He does report occasional night terrors and can act out his dreams (will kick and punch) but does not happen frequently.  Denies prior sleep study.  Does have issues with constipation, admits to limited water intake, usually only with taking pills, feels bloated if he drinks too much water. Denies swallowing issues. Denies any significant visual concerns, routinely followed by ophthalmology.  Has not noticed any tremors.   Routinely sees PCP once yearly, last visit was in the spring. Has f/u with cardiology 2/7.  Discussed prior workup results including MRI brain which showed chronic small vessel disease and generalized cerebral atrophy, EEG which was negative and dementia panel which showed significantly elevated TSH at 50.4, otherwise unremarkable. Can not see where patient was notified of this result therefore he did not have follow up with PCP nor has had repeat labs since.    Consult visit 07/07/2023 Dr. Janett Medin: [name] is a 78 year old pleasant Caucasian male seen today for initial office consultation visit.  History is obtained from the patient and review of electronic medical records.  I personally reviewed pertinent available imaging films in PACS.  He has past medical history of hypertension, coronary artery disease, gastroesophageal reflux disease and right hemispheric TIA in September 2019. He presented to the emergency room on 04/09/2023 with  intermittent headaches and some slurred speech and unsteady gait which began the night before.  His symptoms are fluctuating.  CT head was unremarkable.  MRI scan showed no acute abnormality only changes of small vessel disease.  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  Patient had been diagnosed with atrial fibrillation 2 years prior and was on Eliquis which she had not missed.  This was continued.  He states is done well since then without recurrent TIA or stroke symptoms.  He has remote history of right hemispheric TIA in September 2019 at that time he was seen by me and given significant coronary artery disease was advised to stay on aspirin  and Plavix  for his cardiac stent.  He was last seen in the office in 2020 and did not follow-up since.  Patient also complains of mild memory difficulties which has had for couple of years but seem worse in the last 3 months or so.  He has trouble finding words and often has to stop and midsentence.  At times his mind goes blank.  He has difficult time remembering names.  He is quite independent in activities of daily living.  He is living at home.  He does admit to decrease in attention at times and decreased recall.  He has not had any workup for reversible causes of memory loss.   ROS:   14 system review of systems is positive for those listed in HPI and all other systems negative  PMH:  Past Medical History:  Diagnosis Date   Angina    Coronary artery disease    GERD (gastroesophageal reflux disease)    Hypertension     Social History:  Social History   Socioeconomic History   Marital status: Married    Spouse name: Not on file   Number of children: Not on file   Years of education: Not on file   Highest education level: Not on file  Occupational History   Not on file  Tobacco Use   Smoking status: Never   Smokeless tobacco: Never  Substance and Sexual Activity   Alcohol  use: No   Drug use: No   Sexual activity: Not Currently  Other Topics Concern   Not on file  Social History Narrative   Not on file   Social Drivers of Health   Financial Resource Strain: Not on file  Food Insecurity: Not on file  Transportation Needs: Not on file  Physical Activity: Not on file  Stress: Not on file  Social Connections: Not on file  Intimate Partner Violence: Not on file    Medications:   Current Outpatient Medications on File Prior to Visit  Medication Sig Dispense Refill   acetaminophen  (TYLENOL ) 650 MG CR tablet Take 650 mg by mouth 3 (three) times daily. For arthritis pain     amiodarone (PACERONE) 200 MG tablet Take 200 mg by mouth daily.     amLODipine  (NORVASC ) 5 MG tablet Take 10 mg by mouth daily.   2   aspirin  EC 325 MG tablet Take 325 mg by mouth daily.     atorvastatin  (LIPITOR ) 80 MG tablet Take 1 tablet (80 mg total) by mouth daily at 6 PM. 30 tablet 0   Coenzyme Q10 200 MG capsule Take 1 capsule (200 mg total) by mouth daily. 30 capsule 1   ELIQUIS 5 MG TABS tablet Take 5 mg by mouth 2 (two) times daily.     isosorbide mononitrate (IMDUR) 30 MG 24 hr tablet Take 30 mg by mouth daily.     levothyroxine (SYNTHROID) 50 MCG tablet Take 50 mcg by mouth daily.     sertraline  (ZOLOFT ) 25 MG tablet Take 25 mg by mouth daily.  2   St Johns Wort 150 MG CAPS 1 capsule before a meal Orally Once a day for 30 day(s)     No current facility-administered medications on file prior to visit.    Allergies:   Allergies  Allergen Reactions   Niacin And Related    Other Other (See Comments)    Febreze(lavender) causes itching    Statins     Other Reaction(s): leg weakness    Physical Exam Today's Vitals   05/31/24 1509  BP: (!) 144/66  Pulse: (!) 57  Weight: 131 lb (59.4 kg)  Height: 5' 2 (1.575 m)   Body mass index is 23.96 kg/m.  General: Frail malnourished looking very present elderly Caucasian male, seated, in no evident distress  Neurologic  Exam Mental Status: Awake and fully alert. Oriented to place and time. Recent memory mildly impaired and remote memory intact. Attention span, concentration and fund of knowledge appropriate during visit. Mood and affect appropriate.  Intermittent hypophonia.  Slight  dysarthria but also noted poor denture (reports baseline speech), no evidence of aphasia. Cranial Nerves: Pupils equal, briskly reactive to light. Extraocular movements full without nystagmus. Visual fields full to confrontation. Hearing diminished bilaterally.  Facial sensation intact. Face, tongue, palate moves normally and symmetrically.  Motor: Normal strength in all tested extremity muscles.  Very slight RUE postural tremor and cogwheel rigidity, no evidence of resting tremor.  Unable to appreciate bradykinesia.  No tremor in lower extremities. Sensory.: intact to touch , pinprick , position and vibratory sensation.  Coordination: Rapid alternating movements normal in all extremities. Finger-to-nose and heel-to-shin performed accurately bilaterally. Gait and Station: Arises from chair without difficulty. Stance is hunched. Gait demonstrates normal stride length although slight decreased step height bilaterally and decreased arm swing bilaterally. Some difficulty with turns, tandem walking heel toe not attempted Reflexes: 1+ and symmetric. Toes downgoing.      05/31/2024    3:10 PM 11/17/2023    3:45 PM 07/07/2023    3:05 PM  MMSE - Mini Mental State Exam  Orientation to time 5 5 5   Orientation to Place 5 5 5   Registration 3 3 3   Attention/ Calculation 4 5 5   Recall 3 3 3   Language- name 2 objects 2 2 2   Language- repeat 1 1 0  Language- follow 3 step command 3 3 3   Language- read & follow direction 1 1 1   Write a sentence 1 1 1   Copy design 1 0 0  Total score 29 29 28          ASSESSMENT/PLAN: 78 year old Caucasian male with hx of TIA in 03/2023 (slurred speech, imbalance, headache) and 08/2018 (slurred speech, left  facial weakness, left hand weakness, headache). Hx of atrial fibrillation on Eliquis, HLD, and HTN.  Complains of continued mild memory and balance difficulties. Reported recurrent episode in 09/2023 with slurred speech, left facial weakness and left hand weakness, was not seen in ED as symptoms similar to episodes in 2019 and 2024, no further events.       Recurrent transient symptoms Hx of TIAs -Continue Eliquis given history of atrial fibrillation, aspirin  and atorvastatin  80 mg daily managed/prescribed by PCP/cardiology -Continue close PCP and cardiology follow-up for aggressive stroke risk factor management including BP goal<130/90, and HLD with LDL goal<70  Mild cognitive impairment Parkinsonian symptoms Excessive daytime fatigue -MMSE today 29/30 (prior 29/30) -Dementia panel 06/2023 TSH 50.4 and repeat 57.2 11/2023 - will repeat thyroid  panel today, if remains abnormal, will have patient f/u with PCP ASAP as this could be contributing to his symptoms including parkinsonian symptoms (gait impairment, mild tremor, and cogwheel rigidity). If level now normal or if correct and symptoms persist, will recommend further evaluation.  - reports continued gait difficulties, offered PT, he wishes to further look into insurance coverage and will call if interested in pursuing. Discussed importance of routine exercise and use of cane for fall prevention.  - discussed importance of adequate hydration with gradually increasing daily water intake of at least 64 ounces per day - advised to f/u with PCP regarding underlying anxiety, question suboptimal control, currently on sertraline  25 mg daily, possibly contributing to memory complaints and tremor -MRI brain 08/2023 no acute abnormalities, chronic SVD and generalized atrophy, no change from 03/2023 imaging -EEG 07/2023 normal     Follow-up in 9 months with Dr. Janett Medin or call earlier if needed     I personally spent a total of 40 minutes in the care  of the patient today including preparing to  see the patient, performing a medically appropriate exam/evaluation, counseling and educating, placing orders, and documenting clinical information in the EHR.   Johny Nap, AGNP-BC  [facility] [address] [place], [place] [address]  Phone [phone] Fax [phone] Note: This document was prepared with digital dictation and possible smart phrase technology. Any transcriptional errors that result from this process are unintentional.

## 2024-05-31 ENCOUNTER — Encounter: Payer: Self-pay | Admitting: Adult Health

## 2024-05-31 ENCOUNTER — Ambulatory Visit: Payer: Medicare Other | Admitting: Adult Health

## 2024-05-31 VITALS — BP 144/66 | HR 57 | Ht 62.0 in | Wt 131.0 lb

## 2024-05-31 DIAGNOSIS — R2689 Other abnormalities of gait and mobility: Secondary | ICD-10-CM | POA: Diagnosis not present

## 2024-05-31 DIAGNOSIS — G3184 Mild cognitive impairment, so stated: Secondary | ICD-10-CM | POA: Diagnosis not present

## 2024-05-31 DIAGNOSIS — R7989 Other specified abnormal findings of blood chemistry: Secondary | ICD-10-CM

## 2024-05-31 DIAGNOSIS — R2990 Unspecified symptoms and signs involving the nervous system: Secondary | ICD-10-CM

## 2024-05-31 DIAGNOSIS — G459 Transient cerebral ischemic attack, unspecified: Secondary | ICD-10-CM | POA: Diagnosis not present

## 2024-05-31 NOTE — Patient Instructions (Addendum)
 Your Plan:  We will try to get results from your prior lab work from your PCP office - please ensure you continue to follow with your PCP closely for management of hypothyroidism.  We will repeat lab work today as well   Please let me know if you are interested in doing any type of physical therapy for your gait/balance. Doing regular walking or doing exercises in the pool can help with your balance. If you are walking outdoors, would recommend you bring your cane just in case. Please do this early in the morning to avoid the afternoon heat.       Follow up in 9 months or call earlier if needed       Thank you for coming to see us  at [facility]. I hope we have been able to provide you high quality care today.  You may receive a patient satisfaction survey over the next few weeks. We would appreciate your feedback and comments so that we may continue to improve ourselves and the health of our patients.

## 2024-06-01 ENCOUNTER — Ambulatory Visit: Payer: Self-pay | Admitting: Adult Health

## 2024-06-01 LAB — THYROID PANEL WITH TSH
Free Thyroxine Index: 1.8 (ref 1.2–4.9)
T3 Uptake Ratio: 28 % (ref 24–39)
T4, Total: 6.4 ug/dL (ref 4.5–12.0)
TSH: 9.53 u[IU]/mL — ABNORMAL HIGH (ref 0.450–4.500)

## 2024-06-02 NOTE — Telephone Encounter (Signed)
-----   Message from [place] sent at 06/01/2024  3:56 PM EDT ----- Please contact patient for results - does not use MyChart.  Thyroid  function has shown significant improvement on Synthroid 50 mcg daily.  Prior labs with PCP 01/2024 showed TSH 27.8.  As TSH remains  elevated, although improved, I would recommend he follow back up with PCP to see if further adjustments need to be made especially as he continues to have memory complaints (despite normal MMSE),  tremors, fatigue and gait impairment. ----- Message ----- From: Interface, [facility] Lab Results In Sent: 06/01/2024   5:37 AM EDT To: Johny Nap, NP

## 2024-06-02 NOTE — Telephone Encounter (Signed)
 Called the patient and reviewed the lab results. Overall he has improvement. Pt verbalized understanding. Pt had no questions at this time but was encouraged to call back if questions arise.

## 2025-03-15 ENCOUNTER — Ambulatory Visit: Admitting: Neurology
